# Patient Record
Sex: Male | Born: 1995 | Hispanic: Yes | Marital: Single | Smoking: Current every day smoker
Health system: Southern US, Community
[De-identification: ages and names within clinical notes are randomized; demographics above are authoritative.]

---

## 2021-06-14 ENCOUNTER — Emergency Department (HOSPITAL_COMMUNITY): Payer: Self-pay

## 2021-06-14 ENCOUNTER — Emergency Department (HOSPITAL_COMMUNITY)
Admission: EM | Admit: 2021-06-14 | Discharge: 2021-06-15 | Disposition: A | Discharge status: Home / Self Care | Payer: Self-pay | Attending: Emergency Medicine | Admitting: Emergency Medicine

## 2021-06-14 ENCOUNTER — Other Ambulatory Visit: Payer: Self-pay

## 2021-06-14 ENCOUNTER — Encounter (HOSPITAL_COMMUNITY): Payer: Self-pay

## 2021-06-14 DIAGNOSIS — T148XXA Other injury of unspecified body region, initial encounter: Secondary | ICD-10-CM

## 2021-06-14 DIAGNOSIS — Y9339 Activity, other involving climbing, rappelling and jumping off: Secondary | ICD-10-CM | POA: Insufficient documentation

## 2021-06-14 DIAGNOSIS — Y9259 Other trade areas as the place of occurrence of the external cause: Secondary | ICD-10-CM | POA: Insufficient documentation

## 2021-06-14 DIAGNOSIS — S90512A Abrasion, left ankle, initial encounter: Secondary | ICD-10-CM | POA: Insufficient documentation

## 2021-06-14 DIAGNOSIS — S90511A Abrasion, right ankle, initial encounter: Secondary | ICD-10-CM | POA: Insufficient documentation

## 2021-06-14 DIAGNOSIS — X58XXXA Exposure to other specified factors, initial encounter: Secondary | ICD-10-CM | POA: Insufficient documentation
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
## 2021-06-15 ENCOUNTER — Emergency Department (HOSPITAL_COMMUNITY)
Admission: EM | Admit: 2021-06-15 | Discharge: 2021-06-18 | Disposition: A | Discharge status: Other Acute Inpatient Hospital | Payer: Self-pay | Attending: Emergency Medicine | Admitting: Emergency Medicine

## 2021-06-15 ENCOUNTER — Encounter (HOSPITAL_COMMUNITY): Payer: Self-pay

## 2021-06-15 ENCOUNTER — Emergency Department (HOSPITAL_COMMUNITY): Payer: Self-pay

## 2021-06-15 DIAGNOSIS — Y900 Blood alcohol level of less than 20 mg/100 ml: Secondary | ICD-10-CM | POA: Insufficient documentation

## 2021-06-15 DIAGNOSIS — Z20822 Contact with and (suspected) exposure to covid-19: Secondary | ICD-10-CM | POA: Insufficient documentation

## 2021-06-15 DIAGNOSIS — F1994 Other psychoactive substance use, unspecified with psychoactive substance-induced mood disorder: Secondary | ICD-10-CM | POA: Diagnosis present

## 2021-06-15 DIAGNOSIS — F312 Bipolar disorder, current episode manic severe with psychotic features: Secondary | ICD-10-CM | POA: Insufficient documentation

## 2021-06-15 DIAGNOSIS — R45851 Suicidal ideations: Secondary | ICD-10-CM | POA: Insufficient documentation

## 2021-06-15 DIAGNOSIS — F39 Unspecified mood [affective] disorder: Secondary | ICD-10-CM | POA: Diagnosis present

## 2021-06-15 DIAGNOSIS — N179 Acute kidney failure, unspecified: Secondary | ICD-10-CM | POA: Insufficient documentation

## 2021-06-15 LAB — COMPREHENSIVE METABOLIC PANEL
ALT: 41 U/L (ref 0–44)
AST: 66 U/L — ABNORMAL HIGH (ref 15–41)
Albumin: 5.2 g/dL — ABNORMAL HIGH (ref 3.5–5.0)
Alkaline Phosphatase: 88 U/L (ref 38–126)
Anion gap: 12 (ref 5–15)
BUN: 27 mg/dL — ABNORMAL HIGH (ref 6–20)
CO2: 20 mmol/L — ABNORMAL LOW (ref 22–32)
Calcium: 9.5 mg/dL (ref 8.9–10.3)
Chloride: 100 mmol/L (ref 98–111)
Creatinine, Ser: 1.3 mg/dL — ABNORMAL HIGH (ref 0.61–1.24)
GFR, Estimated: >60 mL/min (ref >60)
Glucose, Bld: 109 mg/dL — ABNORMAL HIGH (ref 70–99)
Potassium: 3.5 mmol/L (ref 3.5–5.1)
Sodium: 132 mmol/L — ABNORMAL LOW (ref 135–145)
Total Bilirubin: 2.5 mg/dL — ABNORMAL HIGH (ref 0.3–1.2)
Total Protein: 9.1 g/dL — ABNORMAL HIGH (ref 6.5–8.1)

## 2021-06-15 LAB — ETHANOL: Alcohol, Ethyl (B): <10 mg/dL (ref <10)

## 2021-06-15 LAB — SALICYLATE LEVEL: Salicylate Lvl: <7 mg/dL — ABNORMAL LOW (ref 7.0–30.0)

## 2021-06-15 LAB — ACETAMINOPHEN LEVEL: Acetaminophen (Tylenol), Serum: <10 ug/mL — ABNORMAL LOW (ref 10–30)

## 2021-06-15 LAB — CBC WITH DIFFERENTIAL/PLATELET
Abs Immature Granulocytes: 0.03 10*3/uL (ref 0.00–0.07)
Basophils Absolute: 0.1 10*3/uL (ref 0.0–0.1)
Basophils Relative: 1 %
Eosinophils Absolute: 0.2 10*3/uL (ref 0.0–0.5)
Eosinophils Relative: 2 %
HCT: 46.3 % (ref 39.0–52.0)
Hemoglobin: 16.4 g/dL (ref 13.0–17.0)
Immature Granulocytes: 0 %
Lymphocytes Relative: 13 %
Lymphs Abs: 1.3 10*3/uL (ref 0.7–4.0)
MCH: 30.4 pg (ref 26.0–34.0)
MCHC: 35.4 g/dL (ref 30.0–36.0)
MCV: 85.9 fL (ref 80.0–100.0)
Monocytes Absolute: 0.7 10*3/uL (ref 0.1–1.0)
Monocytes Relative: 7 %
Neutro Abs: 7.4 10*3/uL (ref 1.7–7.7)
Neutrophils Relative %: 77 %
Platelets: 231 10*3/uL (ref 150–400)
RBC: 5.39 MIL/uL (ref 4.22–5.81)
RDW: 12.5 % (ref 11.5–15.5)
WBC: 9.6 10*3/uL (ref 4.0–10.5)
nRBC: 0 % (ref 0.0–0.2)

## 2021-06-15 LAB — RESP PANEL BY RT-PCR (FLU A&B, COVID) ARPGX2
Influenza A by PCR: NEGATIVE
Influenza B by PCR: NEGATIVE
SARS Coronavirus 2 by RT PCR: NEGATIVE

## 2021-06-15 MED ORDER — ONDANSETRON HCL 4 MG PO TABS: 4.0000 mg | ORAL_TABLET | Freq: Three times a day (TID) | ORAL | Status: DC | PRN

## 2021-06-15 MED ORDER — LORAZEPAM 1 MG PO TABS
1.0000 mg | ORAL_TABLET | ORAL | Status: DC | PRN
  Administered 2021-06-17: 1 mg via ORAL

## 2021-06-15 MED ORDER — ZIPRASIDONE MESYLATE 20 MG IM SOLR
10.0000 mg | Freq: Once | INTRAMUSCULAR | Status: AC
  Administered 2021-06-15: 10 mg via INTRAMUSCULAR
  Filled 2021-06-15: qty 20

## 2021-06-15 MED ORDER — ALUM & MAG HYDROXIDE-SIMETH 200-200-20 MG/5ML PO SUSP: 30.0000 mL | Freq: Four times a day (QID) | ORAL | Status: DC | PRN

## 2021-06-15 MED ORDER — ACETAMINOPHEN 500 MG PO TABS
1000.0000 mg | ORAL_TABLET | Freq: Once | ORAL | Status: AC
Start: 2021-06-15 — End: 2021-06-15
  Administered 2021-06-15: 1000 mg via ORAL
  Filled 2021-06-15: qty 2

## 2021-06-15 NOTE — ED Notes (Signed)
Patient transferred to TCU 27 and was cooperative until he was placed in room. Became aggressive and agitated, resisting treatment - order for restraints and meds given by MD. Patient being placed in restraints and PRN meds for agitation. JRPRN ?

## 2021-06-15 NOTE — ED Notes (Signed)
[facility] assisted pt to [facility] for pt to change from personal clothing into required [place] scrubs per policy  ?
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
## 2021-06-15 NOTE — Discharge Instructions (Signed)
Can use topical neosporin or similar if desired. ?Tylenol or motrin as needed for pain. ?Return here for new concerns. ?
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
## 2021-06-15 NOTE — ED Notes (Signed)
MD at the bedisde ?

## 2021-06-15 NOTE — BH Assessment (Signed)
Clinician reviewed pt's chart in preparation to complete pt's MH Assessment. However, it was documented at 2245 that pt became aggressive and agitated and that pt was being placed in restraints and given medication for agitation. TTS to attempt assessment once pt has calmed down. ?
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
## 2021-06-16 ENCOUNTER — Encounter (HOSPITAL_COMMUNITY): Payer: Self-pay

## 2021-06-16 DIAGNOSIS — F39 Unspecified mood [affective] disorder: Secondary | ICD-10-CM | POA: Diagnosis present

## 2021-06-16 DIAGNOSIS — F1994 Other psychoactive substance use, unspecified with psychoactive substance-induced mood disorder: Secondary | ICD-10-CM | POA: Diagnosis present

## 2021-06-16 LAB — RAPID URINE DRUG SCREEN, HOSP PERFORMED
Amphetamines: POSITIVE — AB
Barbiturates: NOT DETECTED
Benzodiazepines: NOT DETECTED
Cocaine: POSITIVE — AB
Opiates: NOT DETECTED
Tetrahydrocannabinol: POSITIVE — AB

## 2021-06-16 LAB — URINALYSIS, ROUTINE W REFLEX MICROSCOPIC
Bilirubin Urine: NEGATIVE
Glucose, UA: NEGATIVE mg/dL
Hgb urine dipstick: NEGATIVE
Ketones, ur: NEGATIVE mg/dL
Leukocytes,Ua: NEGATIVE
Nitrite: NEGATIVE
Protein, ur: NEGATIVE mg/dL
Specific Gravity, Urine: 1.03 (ref 1.005–1.030)
pH: 6 (ref 5.0–8.0)

## 2021-06-16 MED ORDER — ARIPIPRAZOLE 2 MG PO TABS
2.0000 mg | ORAL_TABLET | Freq: Every day | ORAL | Status: DC
  Administered 2021-06-16 – 2021-06-17 (×2): 2 mg via ORAL
  Filled 2021-06-16 (×2): qty 1

## 2021-06-16 MED ORDER — ESCITALOPRAM OXALATE 10 MG PO TABS
5.0000 mg | ORAL_TABLET | Freq: Every day | ORAL | Status: DC
  Administered 2021-06-16 – 2021-06-18 (×3): 5 mg via ORAL
  Filled 2021-06-16 (×3): qty 1

## 2021-06-16 NOTE — BH Assessment (Signed)
Checked Pt's status. RN reports Pt received medication and is currently too somnolent to participate in tele-assessment. ? ? ?Raymond [name], [facility] [place] ?Triage Specialist ?[phone] ? ?

## 2021-06-16 NOTE — ED Notes (Signed)
Patient resting quietly in bed. Restraints removed and blanket given. Resting with eyes closed, no longer agitated or disruptive. JRPRN ?

## 2021-06-16 NOTE — ED Notes (Signed)
Patient resting quietly in bed with his eyes closed, no outburst or agitation. JRPRN ?

## 2021-06-16 NOTE — Progress Notes (Signed)
Patient meets criteria for inpatient treatment per [facility] No appropriate beds at [facility] currently. CSW faxed referrals to the following facilities for review: ? ?[facility]   ?[facility]   ?[facility]   ?[facility]   ?[facility]   ?[facility]   ?[facility]   ?[facility]   ?[facility]   ?[facility]   ?[facility]   ?[facility]   ?[facility]   ?[facility]  ? ? ?TTS will continue to seek bed placement.  ? ? ?  ?Ruthann Cancer MSW, LCSW ?Clincal Social Worker  ?[facility]  ?

## 2021-06-16 NOTE — ED Notes (Signed)
Patient currently resting quietly with eyes closed.   ?

## 2021-06-16 NOTE — ED Notes (Signed)
Pt was calm, cooperative, and laid in bed with his eyes open from 7:07 AM to the present. Pt sat up to eat three meals. Pt performed ADLs independently. Pt denies SI and HI. Pt reports hearing voices.  ?

## 2021-06-16 NOTE — ED Provider Notes (Signed)
Emergency Medicine Observation Re-evaluation Note ? ?Raymond [name] is a 26 y.o. male, seen on rounds today.  Pt initially presented to the ED for complaints of Suicidal ?Currently, the patient is resting. ? ?Physical Exam  ?BP 104/85 (BP Location: Left Arm)   Pulse 79   Temp 97.7 ?F (36.5 ?C) (Oral)   Resp 18   Ht 1.702 m (5\' 7" )   Wt 68 kg   SpO2 100%   BMI 23.49 kg/m?  ?Physical Exam ?General: resting ?Cardiac: normal rate ?Lungs: breathing easily ?Psych: deferred, resting ? ?ED Course / MDM  ?EKG:EKG Interpretation ? ?Date/Time:  Friday June 15 2021 21:19:16 EDT ?Ventricular Rate:  93 ?PR Interval:  150 ?QRS Duration: 98 ?QT Interval:  352 ?QTC Calculation: 437 ?R Axis:   91 ?Text Interpretation: Normal sinus rhythm Rightward axis Borderline ECG No previous ECGs available Confirmed by [id number] [phone]) on 06/15/2021 9:34:37 PM ? ?I have reviewed the labs performed to date as well as medications administered while in observation.  Recent changes in the last 24 hours include no acute changes. ? ?Plan  ?Current plan is for tele health assessment to determine dispo. ? Raymond [name] is not under involuntary commitment. ? ? ?  ?Mayo Ao, MD ?06/16/21 [phone] ? ?

## 2021-06-16 NOTE — Consult Note (Signed)
[facility] Face-to-Face Psychiatry Consult  ? ?Reason for Consult:  Psychiatric evaluation ?Referring Physician:  ER Physician ?Patient Identification: Raymond [name] ?MRN:  [id number] ?Principal Diagnosis: Substance induced mood disorder (HCC) ?Diagnosis:  Principal Problem: ?  Substance induced mood disorder (HCC) ?Active Problems: ?  Mood disorder with psychosis (HCC) ? ? ?Total Time spent with patient: 1 hour-This includes the use of an interpreter to conduct this interview. ? ?Subjective:   ?Raymond [name] is a 26 y.o. male patient admitted with no previous Psychiatric hx brought in by [facility] for suicide ideation and was seen trying to jump off the [place]. ? ?HPI:  Patient was seen earlier on Friday for scratches all over his legs and wound to his ankle where he reported some AA males were chasing him to hurt him.  He was discharged after wound care but he came back to the ER last night , brought in by [employer].  Patient is Voluntary at this tie.  He was found trying to Jump off a bridge and again stated that some people are out to get him and kill him so he wanted to do it by himself.  At the time he admitted using Cocaine that period.  He was given Geodon injection for agitation and was on restrain temporarily.  He has since calmed dow, off restraint and engaged in meaningful interview.  Evaluation was conducted via a spanish interpreter.  Patient denied previous Psychiatric illness and denied family hx of Mental illness.  During the interview he denied using any drugs but UDS came back positive for Methamphetamine, Cocaine and Marijuana.  He is hearing voices currently and still believes people are chasing him in a car looking to kill him.  He denied feeling suicidal and no Homicidal ideation.  He admitted to feeling anxious and depressed but also reported no triggers.  He moved from [place] to [facility] [place] area two months ago. ?He meets criteria for inpatient hospitalization for safety and stabilization.  Meanwhile  Lexapro and Aripiprazole is prescribed for Depression and Psychosis while in the ER. ? ?Past Psychiatric History: Denied ? ?Risk to Self:   ?Risk to Others:   ?Prior Inpatient Therapy:   ?Prior Outpatient Therapy:   ? ?Past Medical History: History reviewed. No pertinent past medical history. History reviewed. No pertinent surgical history. ?Family History: History reviewed. No pertinent family history. ?Family Psychiatric  History: Denied ?Social History:  ?Social History  ? ?Substance and Sexual Activity  ?Alcohol Use Yes  ? Comment: ocassionally  ?   ?Social History  ? ?Substance and Sexual Activity  ?Drug Use Yes  ? Types: Cocaine  ?  ?Social History  ? ?Socioeconomic History  ? Marital status: Single  ?  Spouse name: Not on file  ? Number of children: Not on file  ? Years of education: Not on file  ? Highest education level: Not on file  ?Occupational History  ? Not on file  ?Tobacco Use  ? Smoking status: Every Day  ?  Types: Cigarettes  ? Smokeless tobacco: Never  ?Vaping Use  ? Vaping Use: Never used  ?Substance and Sexual Activity  ? Alcohol use: Yes  ?  Comment: ocassionally  ? Drug use: Yes  ?  Types: Cocaine  ? Sexual activity: Not on file  ?Other Topics Concern  ? Not on file  ?Social History Narrative  ? Not on file  ? ?Social Determinants of Health  ? ?Financial Resource Strain: Not on file  ?Food Insecurity: Not on  file  ?Transportation Needs: Not on file  ?Physical Activity: Not on file  ?Stress: Not on file  ?Social Connections: Not on file  ? ?Additional Social History: ?  ? ?Allergies:  No Known Allergies ? ?Labs:  ?Results for orders placed or performed during the hospital encounter of 06/15/21 (from the past 48 hour(s))  ?Resp Panel by RT-PCR (Flu A&B, Covid) Nasopharyngeal Swab     Status: None  ? Collection Time: 06/15/21  9:08 PM  ? Specimen: Nasopharyngeal Swab; Nasopharyngeal(NP) swabs in vial transport medium  ?Result Value Ref Range  ? SARS Coronavirus 2 by RT PCR NEGATIVE NEGATIVE  ?   Comment: (NOTE) ?SARS-CoV-2 target nucleic acids are NOT DETECTED. ? ?The SARS-CoV-2 RNA is generally detectable in upper respiratory ?specimens during the acute phase of infection. The lowest ?concentration of SARS-CoV-2 viral copies this assay can detect is ?138 copies/mL. A negative result does not preclude SARS-Cov-2 ?infection and should not be used as the sole basis for treatment or ?other patient management decisions. A negative result may occur with  ?improper specimen collection/handling, submission of specimen other ?than nasopharyngeal swab, presence of viral mutation(s) within the ?areas targeted by this assay, and inadequate number of viral ?copies(<138 copies/mL). A negative result must be combined with ?clinical observations, patient history, and epidemiological ?information. The expected result is Negative. ? ?Fact Sheet for Patients:  ?BloggerCourse.comhttps://www.fda.gov/media/152166/download ? ?Fact Sheet for Healthcare Providers:  ?SeriousBroker.ithttps://www.fda.gov/media/152162/download ? ?This test is no t yet approved or cleared by the [place] FDA and  ?has been authorized for detection and/or diagnosis of SARS-CoV-2 by ?FDA under an Emergency Use Authorization (EUA). This EUA will remain  ?in effect (meaning this test can be used) for the duration of the ?COVID-19 declaration under Section 564(b)(1) of the Act, 21 ?U.S.C.section 360bbb-3(b)(1), unless the authorization is terminated  ?or revoked sooner.  ? ? ?  ? Influenza A by PCR NEGATIVE NEGATIVE  ? Influenza B by PCR NEGATIVE NEGATIVE  ?  Comment: (NOTE) ?The Xpert Xpress SARS-CoV-2/FLU/RSV plus assay is intended as an aid ?in the diagnosis of influenza from Nasopharyngeal swab specimens and ?should not be used as a sole basis for treatment. Nasal washings and ?aspirates are unacceptable for Xpert Xpress SARS-CoV-2/FLU/RSV ?testing. ? ?Fact Sheet for Patients: ?BloggerCourse.comhttps://www.fda.gov/media/152166/download ? ?Fact Sheet for Healthcare  Providers: ?SeriousBroker.ithttps://www.fda.gov/media/152162/download ? ?This test is not yet approved or cleared by the [place] FDA and ?has been authorized for detection and/or diagnosis of SARS-CoV-2 by ?FDA under an Emergency Use Authorization (EUA). This EUA will remain ?in effect (meaning this test can be used) for the duration of the ?COVID-19 declaration under Section 564(b)(1) of the Act, 21 U.S.C. ?section 360bbb-3(b)(1), unless the authorization is terminated or ?revoked. ? ?Performed at [facility], [address] ?[place], [place] [id number] ?  ?Comprehensive metabolic panel     Status: Abnormal  ? Collection Time: 06/15/21  9:08 PM  ?Result Value Ref Range  ? Sodium 132 (L) 135 - 145 mmol/L  ? Potassium 3.5 3.5 - 5.1 mmol/L  ? Chloride 100 98 - 111 mmol/L  ? CO2 20 (L) 22 - 32 mmol/L  ? Glucose, Bld 109 (H) 70 - 99 mg/dL  ?  Comment: Glucose reference range applies only to samples taken after fasting for at least 8 hours.  ? BUN 27 (H) 6 - 20 mg/dL  ? Creatinine, Ser 1.30 (H) 0.61 - 1.24 mg/dL  ? Calcium 9.5 8.9 - 10.3 mg/dL  ? Total Protein 9.1 (H) 6.5 -  8.1 g/dL  ? Albumin 5.2 (H) 3.5 - 5.0 g/dL  ? AST 66 (H) 15 - 41 U/L  ? ALT 41 0 - 44 U/L  ? Alkaline Phosphatase 88 38 - 126 U/L  ? Total Bilirubin 2.5 (H) 0.3 - 1.2 mg/dL  ? GFR, Estimated >60 >60 mL/min  ?  Comment: (NOTE) ?Calculated using the CKD-EPI Creatinine Equation (2021) ?  ? Anion gap 12 5 - 15  ?  Comment: Performed at [facility], [address] [place], [place] [address]  ?Ethanol     Status: None  ? Collection Time: 06/15/21  9:08 PM  ?Result Value Ref Range  ? Alcohol, Ethyl (B) <10 <10 mg/dL  ?  Comment: (NOTE) ?Lowest detectable limit for serum alcohol is 10 mg/dL. ? ?For medical purposes only. ?Performed at [facility], [address] ?[place], [place] [address] ?  ?CBC with Diff     Status: None  ? Collection Time: 06/15/21  9:08 PM  ?Result Value Ref Range  ? WBC 9.6 4.0 - 10.5  K/uL  ? RBC 5.39 4.22 - 5.81 MIL/uL  ? Hemoglobin 16.4 13.0 - 17.0 g/dL  ? HCT 46.3 39.0 - 52.0 %  ? MCV 85.9 80.0 - 100.0 fL  ? MCH 30.4 26.0 - 34.0 pg  ? MCHC 35.4 30.0 - 36.0 g/dL  ? RDW 12.5 11.5 - 15.5 %  ? Platelets 231 150 - 4

## 2021-06-17 MED ORDER — LORAZEPAM 0.5 MG PO TABS: 0.5000 mg | ORAL_TABLET | Freq: Once | ORAL | Status: DC

## 2021-06-17 MED ORDER — OLANZAPINE 5 MG PO TBDP
2.5000 mg | ORAL_TABLET | Freq: Every day | ORAL | Status: DC
  Administered 2021-06-17: 2.5 mg via ORAL
  Filled 2021-06-17: qty 1

## 2021-06-17 MED ORDER — LORAZEPAM 0.5 MG PO TABS
0.5000 mg | ORAL_TABLET | Freq: Two times a day (BID) | ORAL | Status: DC
  Administered 2021-06-17 – 2021-06-18 (×3): 0.5 mg via ORAL
  Filled 2021-06-17 (×3): qty 1

## 2021-06-17 NOTE — ED Notes (Signed)
Patient repeatedly trying to leave room. Patient asking if "people outside were here for him". Used interpreter device to reassure patient safety and to remind to stay in room. Patient understood and went back to room. ?

## 2021-06-17 NOTE — ED Provider Notes (Signed)
Emergency Medicine Observation Re-evaluation Note ? ?Raymond [name] is a 26 y.o. male, seen on rounds today.  Pt initially presented to the ED for complaints of Suicidal ?Currently, the patient is is resting comfortably.  No acute distress.. ? ?Physical Exam  ?BP 131/74 (BP Location: Left Arm)   Pulse 69   Temp 98.6 ?F (37 ?C) (Oral)   Resp 20   Ht 1.702 m (5\' 7" )   Wt 68 kg   SpO2 96%   BMI 23.49 kg/m?  ?Physical Exam ?Constitutional:   ?   General: He is not in acute distress. ?Cardiovascular:  ?   Rate and Rhythm: Normal rate.  ?Pulmonary:  ?   Effort: No respiratory distress.  ?Psychiatric:     ?   Behavior: Behavior normal.  ? ? ? ?ED Course / MDM  ?EKG:EKG Interpretation ? ?Date/Time:  Friday June 15 2021 21:19:16 EDT ?Ventricular Rate:  93 ?PR Interval:  150 ?QRS Duration: 98 ?QT Interval:  352 ?QTC Calculation: 437 ?R Axis:   91 ?Text Interpretation: Normal sinus rhythm Rightward axis Borderline ECG No previous ECGs available Confirmed by [id number] [phone]) on 06/15/2021 9:34:37 PM ? ?I have reviewed the labs performed to date as well as medications administered while in observation.  Recent changes in the last 24 hours include no significant changes or abnormalities or concerns. ? ?Plan  ?Current plan is for behavioral health is continuing to evaluate and is looking for placement.  They are also directing treatment. ? Raymond [name] is not under involuntary commitment. ? ? ?  ?Mayo Ao, MD ?06/17/21 1536 ? ?

## 2021-06-17 NOTE — Consult Note (Signed)
[facility] Psych ED Progress Note ? ?06/17/2021 1:36 PM ?Mayo AoJose Hernandez [name]  ?MRN:  [id number] ? ? ?Method of visit?: Face to Face  ? ?Subjective:  VIA Spanish Interpreter-Patient was seen in his room this morning.  Noted is tremors of his fingers.  He reported feeling the shakes and admitted drinking enough alcohol to withdraw from on weekends.  Ativan is prescribed.  He is still hearing voices and did not sleep last night.  Aripiprazole is discontinued and Olanzapine low dose prescribed.  He is eating and drinking fluids and tolerating it.  We will continue to monitor patient who denies SI/HI/VH. ?Principal Problem: Substance induced mood disorder (HCC) ?Diagnosis:  Principal Problem: ?  Substance induced mood disorder (HCC) ?Active Problems: ?  Mood disorder with psychosis (HCC) ? ?Total Time spent with patient: 20 minutes ? ?Past Psychiatric History: see initial psych consult note ? ?Past Medical History: History reviewed. No pertinent past medical history. History reviewed. No pertinent surgical history. ?Family History: History reviewed. No pertinent family history. ?Family Psychiatric  History: see initial Psych Consult note ?Social History:  ?Social History  ? ?Substance and Sexual Activity  ?Alcohol Use Yes  ? Comment: ocassionally  ?   ?Social History  ? ?Substance and Sexual Activity  ?Drug Use Yes  ? Types: Cocaine  ?  ?Social History  ? ?Socioeconomic History  ? Marital status: Single  ?  Spouse name: Not on file  ? Number of children: Not on file  ? Years of education: Not on file  ? Highest education level: Not on file  ?Occupational History  ? Not on file  ?Tobacco Use  ? Smoking status: Every Day  ?  Types: Cigarettes  ? Smokeless tobacco: Never  ?Vaping Use  ? Vaping Use: Never used  ?Substance and Sexual Activity  ? Alcohol use: Yes  ?  Comment: ocassionally  ? Drug use: Yes  ?  Types: Cocaine  ? Sexual activity: Not on file  ?Other Topics Concern  ? Not on file  ?Social History Narrative  ? Not on file   ? ?Social Determinants of Health  ? ?Financial Resource Strain: Not on file  ?Food Insecurity: Not on file  ?Transportation Needs: Not on file  ?Physical Activity: Not on file  ?Stress: Not on file  ?Social Connections: Not on file  ? ? ?Sleep: Poor ? ?Appetite:  Fair ? ?Current Medications: ?Current Facility-Administered Medications  ?Medication Dose Route Frequency Provider Last Rate Last Admin  ? alum & mag hydroxide-simeth (MAALOX/MYLANTA) 200-200-20 MG/5ML suspension 30 mL  30 mL Oral Q6H PRN Jacalyn LefevreHaviland, Julie, MD      ? escitalopram (LEXAPRO) tablet 5 mg  5 mg Oral Daily Dahlia Byesnuoha, Jenavive Lamboy C, NP   5 mg at 06/17/21 1033  ? LORazepam (ATIVAN) tablet 0.5 mg  0.5 mg Oral Once Earney Navynuoha, Trudy Kory C, NP      ? LORazepam (ATIVAN) tablet 1 mg  1 mg Oral Q4H PRN Jacalyn LefevreHaviland, Julie, MD      ? OLANZapine zydis (ZYPREXA) disintegrating tablet 2.5 mg  2.5 mg Oral QHS Branndon Tuite C, NP      ? ondansetron (ZOFRAN) tablet 4 mg  4 mg Oral Q8H PRN Jacalyn LefevreHaviland, Julie, MD      ? ?No current outpatient medications on file.  ? ? ?Lab Results:  ?Results for orders placed or performed during the hospital encounter of 06/15/21 (from the past 48 hour(s))  ?Resp Panel by RT-PCR (Flu A&B, Covid) Nasopharyngeal Swab     Status:  None  ? Collection Time: 06/15/21  9:08 PM  ? Specimen: Nasopharyngeal Swab; Nasopharyngeal(NP) swabs in vial transport medium  ?Result Value Ref Range  ? SARS Coronavirus 2 by RT PCR NEGATIVE NEGATIVE  ?  Comment: (NOTE) ?SARS-CoV-2 target nucleic acids are NOT DETECTED. ? ?The SARS-CoV-2 RNA is generally detectable in upper respiratory ?specimens during the acute phase of infection. The lowest ?concentration of SARS-CoV-2 viral copies this assay can detect is ?138 copies/mL. A negative result does not preclude SARS-Cov-2 ?infection and should not be used as the sole basis for treatment or ?other patient management decisions. A negative result may occur with  ?improper specimen collection/handling, submission of  specimen other ?than nasopharyngeal swab, presence of viral mutation(s) within the ?areas targeted by this assay, and inadequate number of viral ?copies(<138 copies/mL). A negative result must be combined with ?clinical observations, patient history, and epidemiological ?information. The expected result is Negative. ? ?Fact Sheet for Patients:  ?BloggerCourse.com ? ?Fact Sheet for Healthcare Providers:  ?SeriousBroker.it ? ?This test is no t yet approved or cleared by the [place] FDA and  ?has been authorized for detection and/or diagnosis of SARS-CoV-2 by ?FDA under an Emergency Use Authorization (EUA). This EUA will remain  ?in effect (meaning this test can be used) for the duration of the ?COVID-19 declaration under Section 564(b)(1) of the Act, 21 ?U.S.C.section 360bbb-3(b)(1), unless the authorization is terminated  ?or revoked sooner.  ? ? ?  ? Influenza A by PCR NEGATIVE NEGATIVE  ? Influenza B by PCR NEGATIVE NEGATIVE  ?  Comment: (NOTE) ?The Xpert Xpress SARS-CoV-2/FLU/RSV plus assay is intended as an aid ?in the diagnosis of influenza from Nasopharyngeal swab specimens and ?should not be used as a sole basis for treatment. Nasal washings and ?aspirates are unacceptable for Xpert Xpress SARS-CoV-2/FLU/RSV ?testing. ? ?Fact Sheet for Patients: ?BloggerCourse.com ? ?Fact Sheet for Healthcare Providers: ?SeriousBroker.it ? ?This test is not yet approved or cleared by the [place] FDA and ?has been authorized for detection and/or diagnosis of SARS-CoV-2 by ?FDA under an Emergency Use Authorization (EUA). This EUA will remain ?in effect (meaning this test can be used) for the duration of the ?COVID-19 declaration under Section 564(b)(1) of the Act, 21 U.S.C. ?section 360bbb-3(b)(1), unless the authorization is terminated or ?revoked. ? ?Performed at [facility], [address] ?[place], [place] [address] ?  ?Comprehensive metabolic panel     Status: Abnormal  ? Collection Time: 06/15/21  9:08 PM  ?Result Value Ref Range  ? Sodium 132 (L) 135 - 145 mmol/L  ? Potassium 3.5 3.5 - 5.1 mmol/L  ? Chloride 100 98 - 111 mmol/L  ? CO2 20 (L) 22 - 32 mmol/L  ? Glucose, Bld 109 (H) 70 - 99 mg/dL  ?  Comment: Glucose reference range applies only to samples taken after fasting for at least 8 hours.  ? BUN 27 (H) 6 - 20 mg/dL  ? Creatinine, Ser 1.30 (H) 0.61 - 1.24 mg/dL  ? Calcium 9.5 8.9 - 10.3 mg/dL  ? Total Protein 9.1 (H) 6.5 - 8.1 g/dL  ? Albumin 5.2 (H) 3.5 - 5.0 g/dL  ? AST 66 (H) 15 - 41 U/L  ? ALT 41 0 - 44 U/L  ? Alkaline Phosphatase 88 38 - 126 U/L  ? Total Bilirubin 2.5 (H) 0.3 - 1.2 mg/dL  ? GFR, Estimated >60 >60 mL/min  ?  Comment: (NOTE) ?Calculated using the CKD-EPI Creatinine Equation (2021) ?  ? Anion gap  12 5 - 15  ?  Comment: Performed at [facility], [address] [place], [place] [address]  ?Ethanol     Status: None  ? Collection Time: 06/15/21  9:08 PM  ?Result Value Ref Range  ? Alcohol, Ethyl (B) <10 <10 mg/dL  ?  Comment: (NOTE) ?Lowest detectable limit for serum alcohol is 10 mg/dL. ? ?For medical purposes only. ?Performed at [facility], [address] ?[place], [place] [address] ?  ?CBC with Diff     Status: None  ? Collection Time: 06/15/21  9:08 PM  ?Result Value Ref Range  ? WBC 9.6 4.0 - 10.5 K/uL  ? RBC 5.39 4.22 - 5.81 MIL/uL  ? Hemoglobin 16.4 13.0 - 17.0 g/dL  ? HCT 46.3 39.0 - 52.0 %  ? MCV 85.9 80.0 - 100.0 fL  ? MCH 30.4 26.0 - 34.0 pg  ? MCHC 35.4 30.0 - 36.0 g/dL  ? RDW 12.5 11.5 - 15.5 %  ? Platelets 231 150 - 400 K/uL  ? nRBC 0.0 0.0 - 0.2 %  ? Neutrophils Relative % 77 %  ? Neutro Abs 7.4 1.7 - 7.7 K/uL  ? Lymphocytes Relative 13 %  ? Lymphs Abs 1.3 0.7 - 4.0 K/uL  ? Monocytes Relative 7 %  ? Monocytes Absolute 0.7 0.1 - 1.0 K/uL  ? Eosinophils Relative 2 %  ? Eosinophils Absolute 0.2 0.0 - 0.5 K/uL  ? Basophils  Relative 1 %  ? Basophils Absolute 0.1 0.0 - 0.1 K/uL  ? Immature Granulocytes 0 %  ? Abs Immature Granulocytes 0.03 0.00 - 0.07 K/uL  ?  Comment: Performed at [facility], [address]

## 2021-06-18 ENCOUNTER — Encounter (HOSPITAL_COMMUNITY): Payer: Self-pay | Admitting: Nurse Practitioner

## 2021-06-18 ENCOUNTER — Other Ambulatory Visit: Payer: Self-pay

## 2021-06-18 ENCOUNTER — Inpatient Hospital Stay (HOSPITAL_COMMUNITY)
Admission: AD | Admit: 2021-06-18 | Discharge: 2021-06-28 | DRG: 885 | Disposition: A | Discharge status: Home / Self Care | Payer: Federal, State, Local not specified - Other | Source: Intra-hospital | Attending: Psychiatry | Admitting: Psychiatry

## 2021-06-18 DIAGNOSIS — F39 Unspecified mood [affective] disorder: Principal | ICD-10-CM | POA: Diagnosis present

## 2021-06-18 DIAGNOSIS — F10239 Alcohol dependence with withdrawal, unspecified: Secondary | ICD-10-CM | POA: Diagnosis present

## 2021-06-18 DIAGNOSIS — F17210 Nicotine dependence, cigarettes, uncomplicated: Secondary | ICD-10-CM | POA: Diagnosis present

## 2021-06-18 DIAGNOSIS — F1994 Other psychoactive substance use, unspecified with psychoactive substance-induced mood disorder: Secondary | ICD-10-CM

## 2021-06-18 DIAGNOSIS — E559 Vitamin D deficiency, unspecified: Secondary | ICD-10-CM | POA: Diagnosis present

## 2021-06-18 DIAGNOSIS — Z20822 Contact with and (suspected) exposure to covid-19: Secondary | ICD-10-CM | POA: Diagnosis present

## 2021-06-18 DIAGNOSIS — E781 Pure hyperglyceridemia: Secondary | ICD-10-CM | POA: Diagnosis present

## 2021-06-18 DIAGNOSIS — F329 Major depressive disorder, single episode, unspecified: Secondary | ICD-10-CM | POA: Diagnosis present

## 2021-06-18 DIAGNOSIS — F15159 Other stimulant abuse with stimulant-induced psychotic disorder, unspecified: Secondary | ICD-10-CM | POA: Diagnosis present

## 2021-06-18 DIAGNOSIS — F4323 Adjustment disorder with mixed anxiety and depressed mood: Secondary | ICD-10-CM

## 2021-06-18 DIAGNOSIS — Z79899 Other long term (current) drug therapy: Secondary | ICD-10-CM

## 2021-06-18 DIAGNOSIS — F1590 Other stimulant use, unspecified, uncomplicated: Secondary | ICD-10-CM

## 2021-06-18 DIAGNOSIS — R45851 Suicidal ideations: Secondary | ICD-10-CM | POA: Diagnosis present

## 2021-06-18 DIAGNOSIS — F19959 Other psychoactive substance use, unspecified with psychoactive substance-induced psychotic disorder, unspecified: Principal | ICD-10-CM

## 2021-06-18 DIAGNOSIS — G4700 Insomnia, unspecified: Secondary | ICD-10-CM | POA: Diagnosis present

## 2021-06-18 DIAGNOSIS — F1020 Alcohol dependence, uncomplicated: Secondary | ICD-10-CM

## 2021-06-18 DIAGNOSIS — E785 Hyperlipidemia, unspecified: Secondary | ICD-10-CM | POA: Diagnosis present

## 2021-06-18 LAB — RESP PANEL BY RT-PCR (FLU A&B, COVID) ARPGX2
Influenza A by PCR: NEGATIVE
Influenza B by PCR: NEGATIVE
SARS Coronavirus 2 by RT PCR: NEGATIVE

## 2021-06-18 MED ORDER — OLANZAPINE 5 MG PO TBDP
5.0000 mg | ORAL_TABLET | Freq: Every day | ORAL | Status: DC
  Administered 2021-06-18: 5 mg via ORAL
  Filled 2021-06-18 (×4): qty 1

## 2021-06-18 MED ORDER — ENSURE ENLIVE PO LIQD
237.0000 mL | Freq: Two times a day (BID) | ORAL | Status: DC
  Administered 2021-06-19 – 2021-06-28 (×15): 237 mL via ORAL
  Filled 2021-06-18 (×23): qty 237

## 2021-06-18 MED ORDER — LORAZEPAM 1 MG PO TABS: 1.0000 mg | ORAL_TABLET | ORAL | Status: DC | PRN

## 2021-06-18 MED ORDER — FOLIC ACID 1 MG PO TABS
1.0000 mg | ORAL_TABLET | Freq: Every day | ORAL | Status: DC
  Administered 2021-06-18: 1 mg via ORAL
  Filled 2021-06-18: qty 1

## 2021-06-18 MED ORDER — ALUM & MAG HYDROXIDE-SIMETH 200-200-20 MG/5ML PO SUSP: 30.0000 mL | Freq: Four times a day (QID) | ORAL | Status: DC | PRN

## 2021-06-18 MED ORDER — OLANZAPINE 5 MG PO TBDP: 5.0000 mg | ORAL_TABLET | Freq: Every day | ORAL | Status: DC

## 2021-06-18 MED ORDER — FOLIC ACID 1 MG PO TABS
1.0000 mg | ORAL_TABLET | Freq: Every day | ORAL | Status: DC
  Administered 2021-06-19 – 2021-06-28 (×10): 1 mg via ORAL
  Filled 2021-06-18 (×3): qty 1
  Filled 2021-06-18 (×3): qty 7
  Filled 2021-06-18 (×7): qty 1

## 2021-06-18 MED ORDER — ESCITALOPRAM OXALATE 5 MG PO TABS
5.0000 mg | ORAL_TABLET | Freq: Every day | ORAL | Status: DC
  Administered 2021-06-19: 5 mg via ORAL
  Filled 2021-06-18 (×4): qty 1

## 2021-06-18 MED ORDER — TRAZODONE HCL 50 MG PO TABS
50.0000 mg | ORAL_TABLET | Freq: Every evening | ORAL | Status: DC | PRN
  Administered 2021-06-18 – 2021-06-27 (×8): 50 mg via ORAL
  Filled 2021-06-18 (×2): qty 1
  Filled 2021-06-18: qty 7
  Filled 2021-06-18 (×7): qty 1

## 2021-06-18 MED ORDER — ACETAMINOPHEN 325 MG PO TABS
650.0000 mg | ORAL_TABLET | Freq: Four times a day (QID) | ORAL | Status: DC | PRN
Start: 2021-06-18 — End: 2021-06-26
  Administered 2021-06-18 – 2021-06-19 (×2): 650 mg via ORAL
  Filled 2021-06-18 (×2): qty 2

## 2021-06-18 MED ORDER — THIAMINE HCL 100 MG PO TABS
100.0000 mg | ORAL_TABLET | Freq: Every day | ORAL | Status: DC
  Administered 2021-06-18: 100 mg via ORAL
  Filled 2021-06-18: qty 1

## 2021-06-18 MED ORDER — THIAMINE HCL 100 MG PO TABS
100.0000 mg | ORAL_TABLET | Freq: Every day | ORAL | Status: DC
  Administered 2021-06-19 – 2021-06-28 (×10): 100 mg via ORAL
  Filled 2021-06-18 (×4): qty 1
  Filled 2021-06-18: qty 7
  Filled 2021-06-18 (×3): qty 1
  Filled 2021-06-18: qty 7
  Filled 2021-06-18 (×2): qty 1
  Filled 2021-06-18: qty 7
  Filled 2021-06-18: qty 1

## 2021-06-18 MED ORDER — MAGNESIUM HYDROXIDE 400 MG/5ML PO SUSP: 30.0000 mL | Freq: Every day | ORAL | Status: DC | PRN

## 2021-06-18 MED ORDER — ONDANSETRON HCL 4 MG PO TABS: 4.0000 mg | ORAL_TABLET | Freq: Three times a day (TID) | ORAL | Status: DC | PRN

## 2021-06-18 MED ORDER — NICOTINE 14 MG/24HR TD PT24
14.0000 mg | MEDICATED_PATCH | Freq: Every day | TRANSDERMAL | Status: DC
  Administered 2021-06-19 – 2021-06-28 (×10): 14 mg via TRANSDERMAL
  Filled 2021-06-18 (×14): qty 1

## 2021-06-18 MED ORDER — LORAZEPAM 0.5 MG PO TABS
0.5000 mg | ORAL_TABLET | Freq: Two times a day (BID) | ORAL | Status: DC
  Administered 2021-06-18 – 2021-06-19 (×2): 0.5 mg via ORAL
  Filled 2021-06-18 (×2): qty 1

## 2021-06-18 NOTE — Tx Team (Signed)
Initial Treatment Plan ?06/18/2021 ?7:07 PM ?Mayo Ao ?[id number] ? ? ? ?PATIENT STRESSORS: ?Substance abuse   ?Other: Paranoia   ? ? ?PATIENT STRENGTHS: ?Work skills  ? ? ?PATIENT IDENTIFIED PROBLEMS: ?SI with a plan to jump off a bridge  ?Paranoia   ?Anxiety   ?Substance abuse  ?  ?  ?  ?  ?  ?  ? ?DISCHARGE CRITERIA:  ?Adequate post-discharge living arrangements ?Improved stabilization in mood, thinking, and/or behavior ?Safe-care adequate arrangements made ?Withdrawal symptoms are absent or subacute and managed without 24-hour nursing intervention ? ?PRELIMINARY DISCHARGE PLAN: ?Attend PHP/IOP ?Attend 12-step recovery group ?Outpatient therapy ?Return to previous work or school arrangements ? ?PATIENT/FAMILY INVOLVEMENT: ?This treatment plan has been presented to and reviewed with the patient, Raymond [name].  The patient has been given the opportunity to ask questions and make suggestions. ? ?Sofie Hartigan, RN ?06/18/2021, 7:07 PM ?

## 2021-06-18 NOTE — [facility] Group Notes (Signed)
PT was informed but did not attend group. ?

## 2021-06-18 NOTE — Progress Notes (Addendum)
Pt admitted to [facility]. Patient admitted under involuntary commitment. Patient presents pleasant and cooperative. He reports he has been drinking 30 beers over the weekends on Fridays, Saturdays and sundays and using cocaine and marijuana. He reports he often hears voices and was having thoughts of harming himself, describes voices as unpleasant. He reports he has never been hospitalized and does not take mental health medications. He is able to contract for safety. Patient skin searched and no contraband found. Pt oriented to unit and ward rules. Pt provided dinner, drink and toiletries. He does report a headache and feeling tremulous, palms sweaty.  ?

## 2021-06-18 NOTE — ED Provider Notes (Signed)
Emergency Medicine Observation Re-evaluation Note ? ?Raymond [name] is a 26 y.o. male, seen on rounds today.  Pt initially presented to the ED for complaints of Suicidal ?Currently, the patient is resting, no distress. ? ?Physical Exam  ?BP 104/71 (BP Location: Left Arm)   Pulse 65   Temp 97.6 ?F (36.4 ?C) (Oral)   Resp 18   Ht 5\' 7"  (1.702 m)   Wt 68 kg   SpO2 94%   BMI 23.49 kg/m?  ?Physical Exam ?General: calm, resting ?Cardiac: rrr ?Lungs: equal chest rise noted b/l ?Psych: calm, cooperative with staff currently  ? ?ED Course / MDM  ?EKG:EKG Interpretation ? ?Date/Time:  Friday June 15 2021 21:19:16 EDT ?Ventricular Rate:  93 ?PR Interval:  150 ?QRS Duration: 98 ?QT Interval:  352 ?QTC Calculation: 437 ?R Axis:   91 ?Text Interpretation: Normal sinus rhythm Rightward axis Borderline ECG No previous ECGs available Confirmed by Isla Pence [id number]) on 06/15/2021 9:34:37 PM ? ?I have reviewed the labs performed to date as well as medications administered while in observation.  Recent changes in the last 24 hours include psychiatry has evaluated the patient, patient tremulous, Ativan as needed possible withdrawal. ? ?Plan  ?Current plan is for placement. ? Khylin Kittler is not under involuntary commitment. ? ? ?  ?Jeanell Sparrow, DO ?06/18/21 [phone] ? ?

## 2021-06-18 NOTE — ED Notes (Addendum)
Used video translation service to communicate with pt. Interpreter Areli # [phone] translated. Explained to pt he is transferring to [facility], and he will receive psychiatric treatment at that location. Pt requested we contact his employer [employer] at [employer] [phone] in room 52. I attempted to contact [employer] but no answer. Will continue to attempt contact and pass this information to the [facility] nurse. [employer] does not speak English and will need translation service. Pt is concerned about maintaining his employment and wants to speak with [employer] as soon as possible. Returned pt belongings and he confirmed they are all present. ?

## 2021-06-18 NOTE — Consult Note (Signed)
[facility] ED Progress Note ? ?06/18/2021 11:48 AM ?Mayo Ao  ?MRN:  [id number] ? ? ?Method of visit?: Face to Face  ? ?Subjective:  Seen today via Spanish interpreter.  Patient reported severe anxiety with fingers tremors noted.  He reported suicide ideation off and on and still believes people are out in the community to hurt him.  He also continues to hear voices in his head talking contineously but he cannot make out what the voices are saying.  At the same time he is worried about his job.  Provider explained to him the need to complete treatment before going back to the society.  We will continue to pursue inpatient Psych admission while treating him.  Olanzapine is increased and he is taking Ativan for Alcohol withdrawal symptoms. ?Principal Problem: Substance induced mood disorder (HCC) ?Diagnosis:  Principal Problem: ?  Substance induced mood disorder (HCC) ?Active Problems: ?  Mood disorder with psychosis (HCC) ? ?Total Time spent with patient: 20 minutes ? ?Past Psychiatric History: See initial Psych Consult ? ?Past Medical History: History reviewed. No pertinent past medical history. History reviewed. No pertinent surgical history. ?Family History: History reviewed. No pertinent family history. ?Family Psychiatric  History: see initial psych consult ?Social History:  ?Social History  ? ?Substance and Sexual Activity  ?Alcohol Use Yes  ? Comment: ocassionally  ?   ?Social History  ? ?Substance and Sexual Activity  ?Drug Use Yes  ? Types: Cocaine  ?  ?Social History  ? ?Socioeconomic History  ? Marital status: Single  ?  Spouse name: Not on file  ? Number of children: Not on file  ? Years of education: Not on file  ? Highest education level: Not on file  ?Occupational History  ? Not on file  ?Tobacco Use  ? Smoking status: Every Day  ?  Types: Cigarettes  ? Smokeless tobacco: Never  ?Vaping Use  ? Vaping Use: Never used  ?Substance and Sexual Activity  ? Alcohol use: Yes  ?  Comment: ocassionally   ? Drug use: Yes  ?  Types: Cocaine  ? Sexual activity: Not on file  ?Other Topics Concern  ? Not on file  ?Social History Narrative  ? Not on file  ? ?Social Determinants of Health  ? ?Financial Resource Strain: Not on file  ?Food Insecurity: Not on file  ?Transportation Needs: Not on file  ?Physical Activity: Not on file  ?Stress: Not on file  ?Social Connections: Not on file  ? ? ?Sleep: Fair ? ?Appetite:  Fair ? ?Current Medications: ?Current Facility-Administered Medications  ?Medication Dose Route Frequency Provider Last Rate Last Admin  ? alum & mag hydroxide-simeth (MAALOX/MYLANTA) 200-200-20 MG/5ML suspension 30 mL  30 mL Oral Q6H PRN Jacalyn Lefevre, MD      ? escitalopram (LEXAPRO) tablet 5 mg  5 mg Oral Daily Shantice Menger C, NP   5 mg at 06/18/21 1002  ? folic acid (FOLVITE) tablet 1 mg  1 mg Oral Daily Tanda Rockers A, DO   1 mg at 06/18/21 1002  ? LORazepam (ATIVAN) tablet 0.5 mg  0.5 mg Oral BID Dahlia Byes C, NP   0.5 mg at 06/18/21 1002  ? LORazepam (ATIVAN) tablet 1 mg  1 mg Oral Q4H PRN Jacalyn Lefevre, MD   1 mg at 06/17/21 2127  ? OLANZapine zydis (ZYPREXA) disintegrating tablet 5 mg  5 mg Oral QHS Tiarrah Saville C, NP      ? ondansetron (ZOFRAN) tablet 4 mg  4 mg Oral Q8H PRN Jacalyn Lefevre, MD      ? thiamine tablet 100 mg  100 mg Oral Daily Tanda Rockers A, DO   100 mg at 06/18/21 1002  ? ?No current outpatient medications on file.  ? ? ?Lab Results: No results found for this or any previous visit (from the past 48 hour(s)). ? ?Blood Alcohol level:  ?Lab Results  ?Component Value Date  ? ETH <10 06/15/2021  ? ? ?Physical Findings: ?AIMS:  , ,  ,  ,    ?CIWA:    ?COWS:    ? ?Musculoskeletal: ?Strength & Muscle Tone: within normal limits ?Gait & Station: normal ?Patient leans: Front ? ?Psychiatric Specialty Exam: ? ?Presentation  ?General Appearance: Appropriate for Environment ? ?Eye Contact:Good ? ?Speech:Clear and Coherent; Normal Rate (via Spanish interpreter) ? ?Speech  Volume:Normal (Via Spanish Interpreter.) ? ?Handedness:Right ? ? ?Mood and Affect  ?Mood:Anxious; Depressed ? ?Affect:Congruent ? ? ?Thought Process  ?Thought Processes:Coherent; Goal Directed ? ?Descriptions of Associations:Intact ? ?Orientation:Full (Time, Place and Person) ? ?Thought Content:Logical; Paranoid Ideation ? ?History of Schizophrenia/Schizoaffective disorder:No ? ?Duration of Psychotic Symptoms:No data recorded ?Hallucinations:Hallucinations: Auditory ?Description of Auditory Hallucinations: Hearing voices but cannot make out what is being said ? ?Ideas of Reference:Paranoia (People are out to get him.) ? ?Suicidal Thoughts:Suicidal Thoughts: Yes, Passive ?SI Passive Intent and/or Plan: Without Plan; With Access to Means ? ?Homicidal Thoughts:Homicidal Thoughts: No ? ? ?Sensorium  ?Memory:Immediate Good; Recent Good; Remote Fair ? ?Judgment:Fair ? ?Insight:Good ? ? ?Executive Functions  ?Concentration:Fair ? ?Attention Span:Fair ? ?Recall:Fair ? ?Fund of Knowledge:Fair ? ?Language:Fair ? ? ?Psychomotor Activity  ?Psychomotor Activity:Psychomotor Activity: Tremor ? ? ?Assets  ?Assets:Communication Skills ? ? ?Sleep  ?Sleep:Sleep: Fair ? ? ? ?Physical Exam: ?Physical Exam ?Vitals and nursing note reviewed.  ?Constitutional:   ?   Appearance: Normal appearance.  ?HENT:  ?   Head: Normocephalic and atraumatic.  ?   Nose: Nose normal.  ?Cardiovascular:  ?   Rate and Rhythm: Normal rate and regular rhythm.  ?Musculoskeletal:     ?   General: Normal range of motion.  ?   Cervical back: Normal range of motion.  ?Skin: ?   General: Skin is warm and dry.  ?Neurological:  ?   General: No focal deficit present.  ?   Mental Status: He is alert.  ? ?Review of Systems  ?Constitutional: Negative.   ?HENT: Negative.    ?Eyes: Negative.   ?Respiratory: Negative.    ?Cardiovascular: Negative.   ?Musculoskeletal: Negative.   ?Skin: Negative.   ?Neurological: Negative.   ?Endo/Heme/Allergies: Negative.    ?Psychiatric/Behavioral:  Positive for depression, hallucinations and suicidal ideas.   ?Blood pressure 104/71, pulse 65, temperature 97.6 ?F (36.4 ?C), temperature source Oral, resp. rate 18, height 5\' 7"  (1.702 m), weight 68 kg, SpO2 94 %. Body mass index is 23.49 kg/m?. ? ?Treatment Plan Summary: ?Daily contact with patient to assess and evaluate symptoms and progress in treatment, Medication management, and Plan He continues to meet criteria for inpatient Psych hospitalization.  Olanzapine is increased to 5 mg at bed time while we continue looking for bed. ? ?FREEMON BINFORD, NP-PMHNP-BC ?06/18/2021, 11:48 AM ? ?

## 2021-06-19 DIAGNOSIS — F39 Unspecified mood [affective] disorder: Secondary | ICD-10-CM

## 2021-06-19 LAB — VITAMIN B12: Vitamin B-12: 888 pg/mL (ref 180–914)

## 2021-06-19 LAB — COMPREHENSIVE METABOLIC PANEL
ALT: 38 U/L (ref 0–44)
AST: 42 U/L — ABNORMAL HIGH (ref 15–41)
Albumin: 4.3 g/dL (ref 3.5–5.0)
Alkaline Phosphatase: 107 U/L (ref 38–126)
Anion gap: 8 (ref 5–15)
BUN: 17 mg/dL (ref 6–20)
CO2: 26 mmol/L (ref 22–32)
Calcium: 9.3 mg/dL (ref 8.9–10.3)
Chloride: 106 mmol/L (ref 98–111)
Creatinine, Ser: 0.84 mg/dL (ref 0.61–1.24)
GFR, Estimated: >60 mL/min (ref >60)
Glucose, Bld: 85 mg/dL (ref 70–99)
Potassium: 3.7 mmol/L (ref 3.5–5.1)
Sodium: 140 mmol/L (ref 135–145)
Total Bilirubin: 0.4 mg/dL (ref 0.3–1.2)
Total Protein: 7.6 g/dL (ref 6.5–8.1)

## 2021-06-19 LAB — VITAMIN D 25 HYDROXY (VIT D DEFICIENCY, FRACTURES): Vit D, 25-Hydroxy: 29.73 ng/mL — ABNORMAL LOW (ref 30–100)

## 2021-06-19 LAB — LIPID PANEL
Cholesterol: 196 mg/dL (ref 0–200)
HDL: 49 mg/dL (ref >40)
LDL Cholesterol: 80 mg/dL (ref 0–99)
Total CHOL/HDL Ratio: 4 RATIO
Triglycerides: 333 mg/dL — ABNORMAL HIGH (ref <150)
VLDL: 67 mg/dL — ABNORMAL HIGH (ref 0–40)

## 2021-06-19 LAB — TSH: TSH: 3.094 u[IU]/mL (ref 0.350–4.500)

## 2021-06-19 LAB — HEMOGLOBIN A1C
Hgb A1c MFr Bld: 5.4 % (ref 4.8–5.6)
Mean Plasma Glucose: 108.28 mg/dL

## 2021-06-19 MED ORDER — ADULT MULTIVITAMIN W/MINERALS CH
1.0000 | ORAL_TABLET | Freq: Every day | ORAL | Status: DC
  Administered 2021-06-19 – 2021-06-28 (×10): 1 via ORAL
  Filled 2021-06-19: qty 1
  Filled 2021-06-19: qty 7
  Filled 2021-06-19 (×4): qty 1
  Filled 2021-06-19: qty 7
  Filled 2021-06-19: qty 1
  Filled 2021-06-19: qty 7
  Filled 2021-06-19 (×3): qty 1

## 2021-06-19 MED ORDER — ESCITALOPRAM OXALATE 10 MG PO TABS
10.0000 mg | ORAL_TABLET | Freq: Every day | ORAL | Status: DC
  Administered 2021-06-20 – 2021-06-28 (×9): 10 mg via ORAL
  Filled 2021-06-19: qty 1
  Filled 2021-06-19: qty 7
  Filled 2021-06-19: qty 1
  Filled 2021-06-19: qty 7
  Filled 2021-06-19: qty 1
  Filled 2021-06-19: qty 7
  Filled 2021-06-19 (×4): qty 1
  Filled 2021-06-19: qty 7
  Filled 2021-06-19: qty 1

## 2021-06-19 MED ORDER — ONDANSETRON 4 MG PO TBDP: 4.0000 mg | ORAL_TABLET | Freq: Four times a day (QID) | ORAL | Status: AC | PRN

## 2021-06-19 MED ORDER — HYDROXYZINE HCL 25 MG PO TABS
25.0000 mg | ORAL_TABLET | Freq: Four times a day (QID) | ORAL | Status: AC | PRN
  Administered 2021-06-19 – 2021-06-21 (×3): 25 mg via ORAL
  Filled 2021-06-19 (×3): qty 1

## 2021-06-19 MED ORDER — THIAMINE HCL 100 MG PO TABS: 100.0000 mg | ORAL_TABLET | Freq: Every day | ORAL | Status: DC

## 2021-06-19 MED ORDER — OLANZAPINE 10 MG PO TBDP
10.0000 mg | ORAL_TABLET | Freq: Every day | ORAL | Status: DC
  Administered 2021-06-19: 10 mg via ORAL
  Filled 2021-06-19 (×3): qty 1

## 2021-06-19 MED ORDER — LORAZEPAM 1 MG PO TABS: 1.0000 mg | ORAL_TABLET | Freq: Four times a day (QID) | ORAL | Status: AC | PRN

## 2021-06-19 MED ORDER — LOPERAMIDE HCL 2 MG PO CAPS: 2.0000 mg | ORAL_CAPSULE | ORAL | Status: AC | PRN

## 2021-06-19 NOTE — Group Note (Signed)
Recreation Therapy Group Note ? ? ?Group Topic:Animal Assisted Therapy   ?Group Date: 06/19/2021 ?Start Time: 1430 ?End Time: 1515 ?Facilitators: Caroll Rancher, LRT,CTRS ?Location: [address] Day[address] ? ? ?Animal-Assisted Activity (AAA) Program Checklist/Progress Note ?Patient Eligibility Criteria Checklist & Daily Group note for Rec Tx Intervention ? ?AAA/T Program Assumption of Risk Form signed by Patient/ or Parent Legal Guardian YES ? ?Patient understands their participation is voluntary YES ? ?Group Description: Patients provided opportunity to interact with trained and credentialed [facility] Therapy dog and the community volunteer/dog handler. Patients practiced appropriate animal interaction and were educated on dog safety outside of the hospital in common community settings. Patients were allowed to use dog toys and other items to practice commands, engage the dog in play, and/or complete routine aspects of animal care.  ? ?Education: Charity fundraiser, Health visitor, Communication & Social Skills  ? ? ?Affect/Mood: N/A ?  ?Participation Level: Did not attend ?  ? ?Clinical Observations/Individualized Feedback: Pt attended and participated in group session.  ? ? ?Plan: Continue to engage patient in RT group sessions 2-3x/week. ? ? ?Caroll Rancher, LRT,CTRS ?06/19/2021 4:24 PM ?

## 2021-06-19 NOTE — BHH Suicide Risk Assessment (Signed)
[facility] Admission Suicide Risk Assessment ? ? ?Nursing information obtained from:    ?Demographic factors:  Male, Adolescent or young adult ?Current Mental Status:  Self-harm thoughts, Self-harm behaviors, Suicidal ideation indicated by patient ?Loss Factors:  Loss of significant relationship, Financial problems / change in socioeconomic status ?Historical Factors:  NA ?Risk Reduction Factors:  Employed ? ?Total Time spent with patient: 45 minutes ?Principal Problem: Mood disorder with psychosis (HCC) ?Diagnosis:  Principal Problem: ?  Mood disorder with psychosis (HCC) ? ?Subjective Data:  ?Patient is a 26 year old male who denies having previously diagnosed psychiatric history, who was admitted to the psychiatric hospital for evaluation and treatment of suicidal thoughts with intent and plan to jump off bridge, paranoia, and auditory hallucinations.  He was admitted from [facility], which provided medical clearance. ?  ?Prior to admission psychiatric medications: None ? ?Interview today was conducted by a [place] interpreter ? ?My assessment today, with interpreter, patient reports "I was felt that going crazy" and reports auditory hallucinations for about 2 weeks, with the content of those voices telling him that other people are out to get him, other people are chasing him, and others are out to kill him.  Patient reports he called the police due to this.  Patient reports he started to believe and continues to believe that other people are following him and cars are following him.  He reports he feels overwhelmed due to this.  He reports the hallucinations and paranoia have caused him to feel very depressed, sad, and overwhelmed, to the extent that he has intense suicidal thoughts to kill himself by jumping off a bridge, due to the feeling overwhelmed by the content of the paranoid delusions and auditory hallucinations. ?Patient reports using cocaine on weekends for some time.  Denies meth use.  Reports  marijuana use off and on.  Patient also reports excessive and high level of alcohol use, 20-30 beers per weekend, and 2-3 beers per week day.  Patient reports that recently sleep has been much less, with difficulty initiating and maintaining sleep.  Reports low energy.  Reports appetite is okay. ?At this time he continues to have suicidal thoughts, without intent or plan, and is agreeable to approach staff if suicidal thoughts become more intense, he starts to have intent, or plan.  Denies having homicidal thoughts.  Denies having access to guns. ?Patient reports that anxiety is excessive, elevated, acute, and related to content of paranoid delusions and auditory hallucinations.  Denies having difficulty managing level of anxiety prior to psychotic symptoms. ?Denies panic attacks ?Denies symptoms being criteria for manic or hypomanic episode at this time or in the past. ?Denies history of trauma.  Denies history of abuse. ? ?Continued Clinical Symptoms:  ?Alcohol Use Disorder Identification Test Final Score (AUDIT): 23 ?The "Alcohol Use Disorders Identification Test", Guidelines for Use in Primary Care, Second Edition.  World Science writer [facility]). ?Score between 0-7:  no or low risk or alcohol related problems. ?Score between 8-15:  moderate risk of alcohol related problems. ?Score between 16-19:  high risk of alcohol related problems. ?Score 20 or above:  warrants further diagnostic evaluation for alcohol dependence and treatment. ? ? ?CLINICAL FACTORS:  ? Severe Anxiety and/or Agitation ?Depression:   Hopelessness ?Impulsivity ?Insomnia ?Currently Psychotic ? ? ?Musculoskeletal: ?Strength & Muscle Tone: Laying in bed  ? ?Gait & Station: Laying in bed  ? ?Patient leans: Laying in bed  ? ? ?Psychiatric Specialty Exam: ? ?Presentation  ?General Appearance: Disheveled ? ?Eye Contact:Fleeting ? ?  Speech:Normal Rate ? ?Speech Volume:Normal ? ?Handedness:Right ? ? ?Mood and Affect  ?Mood:Anxious; Depressed;  Labile ? ?Affect:Appropriate; Congruent; Tearful ? ? ?Thought Process  ?Thought Processes:Linear ? ?Descriptions of Associations:Intact ? ?Orientation:Full (Time, Place and Person) ? ?Thought Content:Paranoid Ideation ? ?History of Schizophrenia/Schizoaffective disorder:No ? ?Duration of Psychotic Symptoms:No data recorded ?Hallucinations:Hallucinations: Auditory ? ?Ideas of Reference:Paranoia; Delusions ? ?Suicidal Thoughts:Suicidal Thoughts: Yes, Passive ?SI Passive Intent and/or Plan: Without Intent; Without Plan ? ?Homicidal Thoughts:Homicidal Thoughts: No ? ? ?Sensorium  ?Memory:Immediate Fair; Recent Fair; Remote Fair ? ?Judgment:Poor ? ?Insight:Poor ? ? ?Executive Functions  ?Concentration:Fair ? ?Attention Span:Fair ? ?Recall:Fair ? ?Fund of Knowledge:Fair ? ?Language:Fair ? ? ?Psychomotor Activity  ?Psychomotor Activity:Psychomotor Activity: Normal ? ? ?Assets  ?Assets:Communication Skills ? ? ?Sleep  ?Sleep:Sleep: Poor ? ? ? ?Physical Exam: ?Physical Exam See H&P ? ?ROS See H&P ? ?Blood pressure 109/70, pulse 69, temperature (!) 97.3 ?F (36.3 ?C), temperature source Oral, resp. rate 18, height 5' 5.5" (1.664 m), weight 67.1 kg, SpO2 100 %. Body mass index is 24.25 kg/m?. ? ? ?COGNITIVE FEATURES THAT CONTRIBUTE TO RISK:  ?Active substance use ? ?SUICIDE RISK:  ? Moderate:  Frequent suicidal ideation with limited intensity, and duration, some specificity in terms of plans, no associated intent, good self-control, limited dysphoria/symptomatology, some risk factors present, and identifiable protective factors, including available and accessible social support. ? ?PLAN OF CARE:  ? ?See H&P for my assessment, diagnosis list, and plan. ? ?I certify that inpatient services furnished can reasonably be expected to improve the patient's condition.  ? ?Cristy Hilts, MD ?06/19/2021, 12:56 PM ? ?

## 2021-06-19 NOTE — H&P (Signed)
Psychiatric Admission Assessment Adult ? ?Patient Identification: Raymond AoJose Hernandez [name] ?MRN:  [id number] ?Date of Evaluation:  06/19/2021 ?Chief Complaint:  Mood disorder with psychosis (HCC) [F39] ?Principal Diagnosis: Mood disorder with psychosis (HCC) ?Diagnosis:  Principal Problem: ?  Mood disorder with psychosis (HCC) ? ?History of Present Illness:  ?Patient is a 26 year old male who denies having previously diagnosed psychiatric history, who was admitted to the psychiatric hospital for evaluation and treatment of suicidal thoughts with intent and plan to jump off bridge, paranoia, and auditory hallucinations.  He was admitted from [facility], which provided medical clearance. ? ?Prior to admission psychiatric medications: None ? ?Interview today was conducted by a [place] interpreter ? ?My assessment today, with interpreter, patient reports "I was felt that going crazy" and reports auditory hallucinations for about 2 weeks, with the content of those voices telling him that other people are out to get him, other people are chasing him, and others are out to kill him.  Patient reports he called the police due to this.  Patient reports he started to believe and continues to believe that other people are following him and cars are following him.  He reports he feels overwhelmed due to this.  He reports the hallucinations and paranoia have caused him to feel very depressed, sad, and overwhelmed, to the extent that he has intense suicidal thoughts to kill himself due to the feeling overwhelmed by the content of the paranoid delusions and auditory hallucinations. ?Patient reports using cocaine on weekends for some time.  Denies meth use.  Reports marijuana use off and on.  Patient also reports excessive and high level of alcohol use, 20-30 beers per weekend, and 2-3 beers per week day.  Patient reports that recently sleep has been much less, with difficulty initiating and maintaining sleep.  Reports low  energy.  Reports appetite is okay. ?At this time he continues to have suicidal thoughts, without intent or plan, and is agreeable to approach staff if suicidal thoughts become more intense, he starts to have intent, or plan.  Denies having homicidal thoughts.  Denies having access to guns. ?Patient reports that anxiety is excessive, elevated, acute, and related to content of paranoid delusions and auditory hallucinations.  Denies having difficulty managing level of anxiety prior to psychotic symptoms. ?Denies panic attacks ?Denies symptoms being criteria for manic or hypomanic episode at this time or in the past. ?Denies history of trauma.  Denies history of abuse. ? ?Past psychiatric history: ?Denies history of psychiatric diagnosis in the past ?Denies prior psychiatric hospitalization ?Denies history of suicide attempt ?Denies current psychiatric medication prescription ?Denies history of psychiatric medications ? ?Has medical history: ?Denies acute or chronic medical illness ?Denies history of seizures ?Denies history of surgery ?NKDA ? ?Social history: Single.  No children.  Employed in [employer].  Lives with a friend.  Has been in [place] for 2 months.  Reports she was born in [place], moved to the [place] 4 years ago, and moved to the local area 2 months ago. ? ?Family history: Denies family history of psychiatric illness.  Denies family history of suicide attempt. ? ?Substance use: Alcohol: Drinks 20-30 beers per weekend, drinks 2-3 beers per weekday.  Reports heavy drinking for about 2 years ?Tobacco: Reports smoking about 1 cigarette/month ?Marijuana: Reports off and on use, not daily ?Cocaine use: Reports using cocaine on weekends for some time ?Denies other illicit drug use including meth, heroin and opiates ? ? ? ? ?Total Time spent with patient: 45 minutes ? ? ? ?  Is the patient at risk to self? Yes.    ?Has the patient been a risk to self in the past 6 months? No.  ?Has the patient been a risk to self  within the distant past? No.  ?Is the patient a risk to others? No.  ?Has the patient been a risk to others in the past 6 months? No.  ?Has the patient been a risk to others within the distant past? No.  ? ?Prior Inpatient Therapy:   ?Prior Outpatient Therapy:   ? ?Alcohol Screening: 1. How often do you have a drink containing alcohol?: 2 to 3 times a week ?2. How many drinks containing alcohol do you have on a typical day when you are drinking?: 10 or more ?3. How often do you have six or more drinks on one occasion?: Weekly ?AUDIT-C Score: 10 ?4. How often during the last year have you found that you were not able to stop drinking once you had started?: Weekly ?5. How often during the last year have you failed to do what was normally expected from you because of drinking?: Monthly ?6. How often during the last year have you needed a first drink in the morning to get yourself going after a heavy drinking session?: Weekly ?7. How often during the last year have you had a feeling of guilt of remorse after drinking?: Monthly ?8. How often during the last year have you been unable to remember what happened the night before because you had been drinking?: Weekly ?9. Have you or someone else been injured as a result of your drinking?: No ?10. Has a relative or friend or a doctor or another health worker been concerned about your drinking or suggested you cut down?: No ?Alcohol Use Disorder Identification Test Final Score (AUDIT): 23 ?Alcohol Brief Interventions/Follow-up: Alcohol education/Brief advice ?Substance Abuse History in the last 12 months:  Yes.   ?Consequences of Substance Abuse: ?Negative ?Previous Psychotropic Medications: No  ?Psychological Evaluations: No  ?Past Medical History: History reviewed. No pertinent past medical history. History reviewed. No pertinent surgical history. ?Family History: History reviewed. No pertinent family history. ? ?Tobacco Screening: Reports smoking 1 cigarette/month ? ?Social  History:  ?Social History  ? ?Substance and Sexual Activity  ?Alcohol Use Yes  ? Alcohol/week: 30.0 standard drinks  ? Types: 30 Cans of beer per week  ? Comment: 30 beers on the weekend  ?   ?Social History  ? ?Substance and Sexual Activity  ?Drug Use Yes  ? Types: Cocaine  ?  ?Additional Social History: ?  ?   ?  ?  ?  ?  ?  ?  ?  ?  ?  ?  ? ?Allergies:  No Known Allergies ?Lab Results:  ?Results for orders placed or performed during the hospital encounter of 06/15/21 (from the past 48 hour(s))  ?Resp Panel by RT-PCR (Flu A&B, Covid) Nasopharyngeal Swab     Status: None  ? Collection Time: 06/18/21 12:07 PM  ? Specimen: Nasopharyngeal Swab; Nasopharyngeal(NP) swabs in vial transport medium  ?Result Value Ref Range  ? SARS Coronavirus 2 by RT PCR NEGATIVE NEGATIVE  ?  Comment: (NOTE) ?SARS-CoV-2 target nucleic acids are NOT DETECTED. ? ?The SARS-CoV-2 RNA is generally detectable in upper respiratory ?specimens during the acute phase of infection. The lowest ?concentration of SARS-CoV-2 viral copies this assay can detect is ?138 copies/mL. A negative result does not preclude SARS-Cov-2 ?infection and should not be used as the sole basis for treatment or ?  other patient management decisions. A negative result may occur with  ?improper specimen collection/handling, submission of specimen other ?than nasopharyngeal swab, presence of viral mutation(s) within the ?areas targeted by this assay, and inadequate number of viral ?copies(<138 copies/mL). A negative result must be combined with ?clinical observations, patient history, and epidemiological ?information. The expected result is Negative. ? ?Fact Sheet for Patients:  ?BloggerCourse.com ? ?Fact Sheet for Healthcare Providers:  ?SeriousBroker.it ? ?This test is no t yet approved or cleared by the [place] FDA and  ?has been authorized for detection and/or diagnosis of SARS-CoV-2 by ?FDA under an Emergency Use  Authorization (EUA). This EUA will remain  ?in effect (meaning this test can be used) for the duration of the ?COVID-19 declaration under Section 564(b)(1) of the Act, 21 ?U.S.C.section 360bbb-3(b)(1), unless t

## 2021-06-19 NOTE — Progress Notes (Signed)
Patient endorses AH and denies SI/HI/VH and verbally contracted for safety, with the use of video remote interpreter Spanish to [place]. Compliant with medications, no adverse drug noted. Q 15 minutes safety checks ongoing without self harm gestures  ? ?Support and encouragement provided.  ?

## 2021-06-19 NOTE — BHH Group Notes (Signed)
Pt did not attend group. 

## 2021-06-19 NOTE — Progress Notes (Signed)
NUTRITION ASSESSMENT ? ?Pt identified as at risk on the Malnutrition Screen Tool ? ?INTERVENTION: ?1. Supplements: Ensure Plus High Protein po BID, each supplement provides 350 kcal and 20 grams of protein.  ? ?NUTRITION DIAGNOSIS: ?Unintentional weight loss related to sub-optimal intake as evidenced by pt report.  ? ?Goal: ?Pt to meet >/= 90% of their estimated nutrition needs. ? ?Monitor:  ?PO intake ? ?Assessment:  ?Pt admitted for substance abuse (EtOH, cocaine, THC). Pt reports drinking 30 beers over the weekend.  ?Per weight records, pt has lost 12 lbs since 4/20. Suspect time frame is inaccurate. ?Ensure supplements have been ordered. ? ? ?Height: ?Ht Readings from Last 1 Encounters:  ?06/18/21 5' 5.5" (1.664 m)  ? ? ?Weight: ?Wt Readings from Last 1 Encounters:  ?06/18/21 67.1 kg  ? ? ?Weight Hx: ?Wt Readings from Last 10 Encounters:  ?06/18/21 67.1 kg  ?06/15/21 68 kg  ?06/14/21 72.6 kg  ? ? ?BMI:  Body mass index is 24.25 kg/m?Marland Kitchen ?Pt meets criteria for normal based on current BMI. ? ?Estimated Nutritional Needs: ?Kcal: 25-30 kcal/kg ?Protein: > 1 gram protein/kg ?Fluid: 1 ml/kcal ? ?Diet Order:  ?Diet Order   ? ?       ?  Diet regular Room service appropriate? Yes; Fluid consistency: Thin  Diet effective now       ?  ? ?  ?  ? ?  ? ?Pt is also offered choice of unit snacks mid-morning and mid-afternoon.  ?Pt is eating as desired.  ? ?Lab results and medications reviewed.  ? ?Tilda Franco, MS, RD, LDN ?Inpatient Clinical Dietitian ?Contact information available via Amion ? ? ?

## 2021-06-19 NOTE — Progress Notes (Addendum)
Pt denies HI but endorses AVH, "shadows moving in my room and voices saying weird, nonsense things." Pt also endorses SI on/off and verbally agrees to approach staff if these become apparent or before harming themselves/others. Rates depression 7/10. Rates anxiety 6/10. Rates pain 0/10. RN spoke with interpretor and upon her entering his room, he was tearful and told the interpretor that the reason he was on the bridge was because he feels lonely since all his family is in [place] except for some cousin in [place], [place]. Pt states he wants to go back to [place]. Pt stated that his mother has a medical diagnosis and is stressing about that. 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 Pt left phone on a charging station at the ED. It is a [employer] and it has all his phone numbers in it that he needs. AC notified. Scheduled medications administered to pt, per MD orders. RN provided support and encouragement to pt. Q15 min safety checks implemented and continued. Pt safe on the unit. RN will continue to monitor and intervene as needed.  ? 06/19/21 0728  ?Psych Admission Type (Psych Patients Only)  ?Admission Status Involuntary  ?Psychosocial Assessment  ?Patient Complaints Self-harm thoughts;Other (Comment) ?(AVH)  ?Eye Contact Fair  ?Facial Expression Sad  ?Affect Appropriate to circumstance;Sad  ?Speech Logical/coherent  ?Interaction Forwards little;Minimal  ?Motor Activity Slow  ?Appearance/Hygiene In scrubs  ?Behavior Characteristics Cooperative;Appropriate to situation  ?Mood Sad;Pleasant  ?Thought Process  ?Coherency WDL  ?Content Blaming others;Blaming self  ?Delusions Paranoid  ?Perception Hallucinations  ?Hallucination Auditory;Visual  ?Judgment Impaired  ?Confusion None  ?Danger to Self  ?Current suicidal ideation? Passive  ?Self-Injurious Behavior No self-injurious ideation or behavior indicators observed or expressed   ?Agreement Not to Harm Self Yes  ?Description of Agreement  verbal  ?Danger to Others  ?Danger to Others None reported or observed  ? ? ?

## 2021-06-20 ENCOUNTER — Encounter (HOSPITAL_COMMUNITY): Payer: Self-pay

## 2021-06-20 DIAGNOSIS — F1020 Alcohol dependence, uncomplicated: Secondary | ICD-10-CM

## 2021-06-20 DIAGNOSIS — F4323 Adjustment disorder with mixed anxiety and depressed mood: Secondary | ICD-10-CM

## 2021-06-20 DIAGNOSIS — F1590 Other stimulant use, unspecified, uncomplicated: Secondary | ICD-10-CM

## 2021-06-20 DIAGNOSIS — F19959 Other psychoactive substance use, unspecified with psychoactive substance-induced psychotic disorder, unspecified: Principal | ICD-10-CM

## 2021-06-20 DIAGNOSIS — F39 Unspecified mood [affective] disorder: Secondary | ICD-10-CM | POA: Diagnosis not present

## 2021-06-20 MED ORDER — OLANZAPINE 10 MG IM SOLR: 5.0000 mg | Freq: Three times a day (TID) | INTRAMUSCULAR | Status: DC | PRN

## 2021-06-20 MED ORDER — OLANZAPINE 2.5 MG PO TABS
12.5000 mg | ORAL_TABLET | Freq: Every day | ORAL | Status: DC
  Administered 2021-06-20: 12.5 mg via ORAL
  Filled 2021-06-20 (×2): qty 5

## 2021-06-20 MED ORDER — OLANZAPINE 2.5 MG PO TABS
2.5000 mg | ORAL_TABLET | Freq: Every day | ORAL | Status: DC
  Administered 2021-06-21 – 2021-06-24 (×4): 2.5 mg via ORAL
  Filled 2021-06-20 (×7): qty 1

## 2021-06-20 MED ORDER — OLANZAPINE 2.5 MG PO TABS
2.5000 mg | ORAL_TABLET | Freq: Once | ORAL | Status: AC
  Administered 2021-06-20: 2.5 mg via ORAL
  Filled 2021-06-20 (×2): qty 1

## 2021-06-20 MED ORDER — OLANZAPINE 5 MG PO TABS: 5.0000 mg | ORAL_TABLET | Freq: Three times a day (TID) | ORAL | Status: DC | PRN

## 2021-06-20 NOTE — Progress Notes (Signed)
Chaplain provided support to [place] with the assistance of an interpreter. He was very tearful as he shared about the thoughts that he has been having.  He also shared about how sad he is to not be able to talk with his family.  His mother's birthday was yesterday but because his phone was lost in the ED, he does not have their phone numbers and can't call them.  He is also very worried about them not know where he is and worrying about him.  His mother has some health issues and this has made the separation even more difficult.  Chaplain provided listening, prayer and a Bible in [place], at patient's request. ? ?[employer], Bcc ?Pager, [phone] ?

## 2021-06-20 NOTE — BHH Group Notes (Signed)
Patient did not attend the NA group. 

## 2021-06-20 NOTE — BH IP Treatment Plan (Signed)
Interdisciplinary Treatment and Diagnostic Plan Update ? ?06/20/2021 ?Time of Session: 9:30am  ?Raymond [name] ?MRN: [id number] ? ?Principal Diagnosis: Psychoactive substance-induced psychosis ([place]) ? ?Secondary Diagnoses: Principal Problem: ?  Psychoactive substance-induced psychosis ([place]) ?Active Problems: ?  Adjustment disorder with mixed anxiety and depressed mood ?  Alcohol use disorder, severe, dependence ([place]) ?  Stimulant use disorder ? ? ?Current Medications:  ?Current Facility-Administered Medications  ?Medication Dose Route Frequency Provider Last Rate Last Admin  ? acetaminophen (TYLENOL) tablet 650 mg  650 mg Oral Q6H PRN Charmaine Downs C, NP   650 mg at 06/19/21 1048  ? alum & mag hydroxide-simeth (MAALOX/MYLANTA) 200-200-20 MG/5ML suspension 30 mL  30 mL Oral Q6H PRN Charmaine Downs C, NP      ? escitalopram (LEXAPRO) tablet 10 mg  10 mg Oral Daily Massengill, Ovid Curd, MD   10 mg at 06/20/21 0801  ? feeding supplement (ENSURE ENLIVE / ENSURE PLUS) liquid 237 mL  237 mL Oral BID BM Hill, Jackie Plum, MD   237 mL at 22/97/98 9211  ? folic acid (FOLVITE) tablet 1 mg  1 mg Oral Daily Onuoha, Josephine C, NP   1 mg at 06/20/21 0801  ? hydrOXYzine (ATARAX) tablet 25 mg  25 mg Oral Q6H PRN Janine Limbo, MD   25 mg at 06/20/21 0646  ? loperamide (IMODIUM) capsule 2-4 mg  2-4 mg Oral PRN Massengill, Ovid Curd, MD      ? LORazepam (ATIVAN) tablet 1 mg  1 mg Oral Q6H PRN Massengill, Nathan, MD      ? magnesium hydroxide (MILK OF MAGNESIA) suspension 30 mL  30 mL Oral Daily PRN Charmaine Downs C, NP      ? multivitamin with minerals tablet 1 tablet  1 tablet Oral Daily Massengill, Nathan, MD   1 tablet at 06/20/21 0802  ? nicotine (NICODERM CQ - dosed in mg/24 hours) patch 14 mg  14 mg Transdermal Daily Maida Sale, MD   14 mg at 06/20/21 0802  ? OLANZapine zydis (ZYPREXA) disintegrating tablet 10 mg  10 mg Oral QHS Massengill, Nathan, MD   10 mg at 06/19/21 2104  ? ondansetron  (ZOFRAN-ODT) disintegrating tablet 4 mg  4 mg Oral Q6H PRN Massengill, Ovid Curd, MD      ? thiamine tablet 100 mg  100 mg Oral Daily Onuoha, Josephine C, NP   100 mg at 06/20/21 0802  ? traZODone (DESYREL) tablet 50 mg  50 mg Oral QHS PRN Charmaine Downs C, NP   50 mg at 06/19/21 2104  ? ?PTA Medications: ?No medications prior to admission.  ? ? ?Patient Stressors: Substance abuse   ?Other: Paranoia   ? ?Patient Strengths: Work Psychologist, occupational  ? ?Treatment Modalities: Medication Management, Group therapy, Case management,  ?1 to 1 session with clinician, Psychoeducation, Recreational therapy. ? ? ?Physician Treatment Plan for Primary Diagnosis: Psychoactive substance-induced psychosis ([place]) ?Long Term Goal(s): Improvement in symptoms so as ready for discharge  ? ?Short Term Goals: Ability to identify changes in lifestyle to reduce recurrence of condition will improve ?Ability to verbalize feelings will improve ?Ability to disclose and discuss suicidal ideas ?Ability to demonstrate self-control will improve ?Ability to identify and develop effective coping behaviors will improve ?Ability to maintain clinical measurements within normal limits will improve ?Compliance with prescribed medications will improve ?Ability to identify triggers associated with substance abuse/mental health issues will improve ? ?Medication Management: Evaluate patient's response, side effects, and tolerance of medication regimen. ? ?Therapeutic Interventions: 1 to 1 sessions, Unit  Group sessions and Medication administration. ? ?Evaluation of Outcomes: Not Met ? ?Physician Treatment Plan for Secondary Diagnosis: Principal Problem: ?  Psychoactive substance-induced psychosis ([place]) ?Active Problems: ?  Adjustment disorder with mixed anxiety and depressed mood ?  Alcohol use disorder, severe, dependence ([place]) ?  Stimulant use disorder ? ?Long Term Goal(s): Improvement in symptoms so as ready for discharge  ? ?Short Term Goals: Ability to identify  changes in lifestyle to reduce recurrence of condition will improve ?Ability to verbalize feelings will improve ?Ability to disclose and discuss suicidal ideas ?Ability to demonstrate self-control will improve ?Ability to identify and develop effective coping behaviors will improve ?Ability to maintain clinical measurements within normal limits will improve ?Compliance with prescribed medications will improve ?Ability to identify triggers associated with substance abuse/mental health issues will improve    ? ?Medication Management: Evaluate patient's response, side effects, and tolerance of medication regimen. ? ?Therapeutic Interventions: 1 to 1 sessions, Unit Group sessions and Medication administration. ? ?Evaluation of Outcomes: Not Met ? ? ?RN Treatment Plan for Primary Diagnosis: Psychoactive substance-induced psychosis ([place]) ?Long Term Goal(s): Knowledge of disease and therapeutic regimen to maintain health will improve ? ?Short Term Goals: Ability to remain free from injury will improve, Ability to participate in decision making will improve, Ability to verbalize feelings will improve, Ability to disclose and discuss suicidal ideas, and Ability to identify and develop effective coping behaviors will improve ? ?Medication Management: RN will administer medications as ordered by provider, will assess and evaluate patient's response and provide education to patient for prescribed medication. RN will report any adverse and/or side effects to prescribing provider. ? ?Therapeutic Interventions: 1 on 1 counseling sessions, Psychoeducation, Medication administration, Evaluate responses to treatment, Monitor vital signs and CBGs as ordered, Perform/monitor CIWA, COWS, AIMS and Fall Risk screenings as ordered, Perform wound care treatments as ordered. ? ?Evaluation of Outcomes: Not Met ? ? ?LCSW Treatment Plan for Primary Diagnosis: Psychoactive substance-induced psychosis ([place]) ?Long Term Goal(s): Safe transition to  appropriate next level of care at discharge, Engage patient in therapeutic group addressing interpersonal concerns. ? ?Short Term Goals: Engage patient in aftercare planning with referrals and resources, Increase social support, Increase emotional regulation, Facilitate acceptance of mental health diagnosis and concerns, Identify triggers associated with mental health/substance abuse issues, and Increase skills for wellness and recovery ? ?Therapeutic Interventions: Assess for all discharge needs, 1 to 1 time with Education officer, museum, Explore available resources and support systems, Assess for adequacy in community support network, Educate family and significant other(s) on suicide prevention, Complete Psychosocial Assessment, Interpersonal group therapy. ? ?Evaluation of Outcomes: Not Met ? ? ?Progress in Treatment: ?Attending groups: Yes. ?Participating in groups: Yes. ?Taking medication as prescribed: Yes. ?Toleration medication: Yes. ?Family/Significant other contact made: Yes, individual(s) contacted:  If consents are provided  ?Patient understands diagnosis: No. ?Discussing patient identified problems/goals with staff: Yes. ?Medical problems stabilized or resolved: Yes. ?Denies suicidal/homicidal ideation: Yes. ?Issues/concerns per patient self-inventory: No. ? ? ?New problem(s) identified: No, Describe:  None  ? ?New Short Term/Long Term Goal(s): medication stabilization, elimination of SI thoughts, development of comprehensive mental wellness plan.  ? ?Patient Goals: Did not attend  ? ?Discharge Plan or Barriers: Patient recently admitted. CSW will continue to follow and assess for appropriate referrals and possible discharge planning.  ? ?Reason for Continuation of Hospitalization: Anxiety ?Hallucinations ?Medication stabilization ?Suicidal ideation ? ?Estimated Length of Stay: 3 to 5 days  ? ?Last 3 [place] Suicide Severity Risk Score: ?[facility] Admission (Current)  from 06/18/2021 in [facility] ED from 06/15/2021 in [facility] ED from 06/14/2021 in [facility] DEPT  ?C-SSRS RISK CATEGORY High Risk High Risk No Risk  ? ?

## 2021-06-20 NOTE — BHH Counselor (Signed)
Adult Comprehensive Assessment ? ?Patient ID: Raymond [name], male   DOB: [birth year], 26 y.o.   MRN: [id number] ? ?Information Source: ?Information source: Interpreter ? ?Current Stressors:  ?Patient states their primary concerns and needs for treatment are:: Patient reports that he has been frustrated, deperate, and wanting to jump off a bridge ?Patient states their goals for this hospitilization and ongoing recovery are:: patient would like the bad thoughts to not be there ?Educational / Learning stressors: no stressors ?Employment / Job issues: no stressors, not sure what his next work is but usually finds work ?Family Relationships: Patient family all lives in [place]. Patient reports that he has not had very much contact due to leaving his phone in ED last Saturday.  Patient was having contact with family but has not since last saturday.  He can not remember phone numbers. ?Financial / Lack of resources (include bankruptcy): no income ?Housing / Lack of housing: patient has been living in hotel rooms ?Physical health (include injuries & life threatening diseases): no stressors ?Social relationships: patient discussed having very little social relationships.  He has one friend local who he provided permission for [place] to speak to ?Substance abuse: Cocaine and a little alcohol.  Patient reports neither affects his daily living ?Bereavement / Loss: no recent loss ? ?Living/Environment/Situation:  ?Living Arrangements: Alone ?Living conditions (as described by patient or guardian): patient lives in hotels ?Who else lives in the home?: no one ?How long has patient lived in current situation?: 5 years ?What is atmosphere in current home: Temporary ? ?Family History:  ?Marital status: Single ?Are you sexually active?: No ?What is your sexual orientation?: heterosexual ?Has your sexual activity been affected by drugs, alcohol, medication, or emotional stress?: no ?Does patient have children?: No ? ?Childhood History:   ?By whom was/is the patient raised?: Both parents ?Additional childhood history information: patient reports that it was difficult due to father dying when he was 53 years old ?Description of patient's relationship with caregiver when they were a child: good ?Patient's description of current relationship with people who raised him/her: good ?How were you disciplined when you got in trouble as a child/adolescent?: normally ?Does patient have siblings?: Yes ?Number of Siblings: 6 ?Description of patient's current relationship with siblings: good ?Did patient suffer any verbal/emotional/physical/sexual abuse as a child?: No ?Did patient suffer from severe childhood neglect?: No ?Has patient ever been sexually abused/assaulted/raped as an adolescent or adult?: No ?Was the patient ever a victim of a crime or a disaster?: No ?Witnessed domestic violence?: No ?Has patient been affected by domestic violence as an adult?: No ? ?Education:  ?Highest grade of school patient has completed: finished secondary school ?Currently a student?: No ?Learning disability?: Yes ?What learning problems does patient have?: patient was told he had a hard time in math ? 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 ?Financial Resources:   ?Financial resources: Income from employment ?Does patient have a representative payee or guardian?: No ? ?Alcohol/Substance Abuse:   ?What has been your use of drugs/alcohol within the last 12 months?: Cocaine and a little alcohol ?If attempted suicide, did drugs/alcohol play a role in this?: No ?Alcohol/Substance Abuse Treatment Hx: Denies past history ?If yes,  describe treatment: none ?Has alcohol/substance abuse  ever caused legal problems?: No ? ?Social Support System:   ?Patient's Community Support System: Poor ?Describe Community Support System: friend and family in [place] ?Type of faith/religion: [employer] ?How does patient's faith help to cope with current illness?: patient reports that he reads the bible ? ?Leisure/Recreation:   ?Do You Have Hobbies?: Yes ?Leisure and Hobbies: window shopping, drawing, reading ? ?Strengths/Needs:   ?What is the patient's perception of their strengths?: patient states that he is always positive and always going to try something before giving up ?Patient states they can use these personal strengths during their treatment to contribute to their recovery: yes ?Patient states these barriers may affect/interfere with their treatment: none ?Patient states these barriers may affect their return to the community: none ?Other important information patient would like considered in planning for their treatment: none ? ?Discharge Plan:   ?Currently receiving community mental health services: No ?Patient states concerns and preferences for aftercare planning are: interpreter ?Patient states they will know when they are safe and ready for discharge when: patient states that he wants negative thoughts to go away ?Does patient have access to transportation?: No ?Does patient have financial barriers related to discharge medications?: Yes ?Patient description of barriers related to discharge medications: no insurance ?Plan for no access to transportation at discharge: CSW will continue to assess ?Will patient be returning to same living situation after discharge?: Yes ? ?Summary/Recommendations:   ?Summary and Recommendations (to be completed by the evaluator): Raymond [name] is a 26 year old male who was admitted to [facility] due to suicidal thoughts, paranoia and auditory hallucinations.  Patient endorses cocaine use on the weekends for the past 2 years and a little alcohol use.  Patient not interested in  substance use treatment at this time and reports that it does not affect his daily life.  [place] the [place] 5 [place] and has been working with his hands and doing piping work.  He reports that he moves around based off where the work is.  Patient currently does not have insurance and has very little income.  He would be interested in counseling services.  While here, Haseeb can benefit from crisis stabilization, medication management, therapeutic milieu, and referrals for services. ? ?Akila Batta E Harneet Noblett. 06/20/2021 ?

## 2021-06-20 NOTE — Group Note (Signed)
Recreation Therapy Group Note ? ? ?Group Topic:Stress Management  ?Group Date: 06/20/2021 ?Start Time: 38 ?End Time: 0955 ?Facilitators: Caroll Rancher, LRT,CTRS ?Location: [address] Dayroom ? ? ?Goal Area(s) Addresses:  ?Patient will identify positive stress management techniques. ?Patient will identify benefits of using stress management post d/c. ? ?Group Description:  Meditation.  LRT played a meditation that focused on setting intention for the day.  It also focused on making the best use for your time and not focusing on things from the previous day but to focus on what you can in the now.  Patients were to listen and follow along as meditation played to fully engage.  Patients also learned they could find other stress management techniques from Youtube, apps and the Internet in general. ? ? ?Affect/Mood: Appropriate ?  ?Participation Level: Engaged ?  ?Participation Quality: Independent ?  ?Behavior: Appropriate ?  ?Speech/Thought Process: Focused ?  ?Insight: Good ?  ?Judgement: Good ?  ?Modes of Intervention: Meditation ?  ?Patient Response to Interventions:  Engaged ?  ?Education Outcome: ? Acknowledges education and In group clarification offered   ? ?Clinical Observations/Individualized Feedback: With the assistance of an interpreter, pt was able to engage in group meditation session.   ? ? ?Plan: Continue to engage patient in RT group sessions 2-3x/week. ? ? ?Caroll Rancher, LRT,CTRS ?06/20/2021 12:21 PM ?

## 2021-06-20 NOTE — Progress Notes (Signed)
?   06/20/21 0800  ?Psych Admission Type (Psych Patients Only)  ?Admission Status Involuntary  ?Psychosocial Assessment  ?Patient Complaints None  ?Eye Contact Fair  ?Facial Expression Flat  ?Affect Flat  ?Speech Logical/coherent  ?Interaction Forwards little  ?Motor Activity Slow  ?Appearance/Hygiene In scrubs  ?Behavior Characteristics Cooperative;Appropriate to situation;Calm  ?Mood Pleasant  ?Thought Process  ?Coherency WDL  ?Content WDL  ?Delusions None reported or observed  ?Perception WDL  ?Hallucination None reported or observed  ?Judgment Impaired  ?Confusion None  ?Danger to Self  ?Current suicidal ideation? Passive  ?Self-Injurious Behavior No self-injurious ideation or behavior indicators observed or expressed   ?Agreement Not to Harm Self Yes  ?Description of Agreement Verbal  ?Danger to Others  ?Danger to Others None reported or observed  ? ? ?

## 2021-06-20 NOTE — Progress Notes (Signed)
Patient compliant with medications endorses AH and Passive SI.Q 15 minutes safety checks ongoing. Pt. Contracts for safety at this time. Patient has been visible in Milieu. Support and encouragement provided.  ?

## 2021-06-20 NOTE — Group Note (Signed)
LCSW Group Therapy Note ? ? ?Group Date: 06/20/2021 ?Start Time: 1300 ?End Time: 1400 ? ?Type of Therapy and Topic:  Group Therapy:  Strengths Exploration ? ? ?Participation Level: Active ? ?Description of Group: ?This group allows individuals to explore their strengths, learn to use strengths in new ways to improve well-being. Strengths-based interventions involve identifying strengths, understanding how they are used, and learning new ways to apply them. Individuals will identify their strengths, and then explore their roles in different areas of life (relationships, professional life, and personal fulfillment). Individuals will think about ways in which they currently use their strengths, along with new ways they could begin using them. ? ?  ?Therapeutic Goals ?Patient will verbalize two of their strengths ?Patient will identify how their strengths are currently used ?Patient will identify two new ways to apply their strengths  ?Patients will create a plan to apply their strengths in their daily lives  ?  ? ?Summary of Patient Progress:  The Pt attended group and remained there the entire time.  The Pt accepted the worksheets that were provided and followed along throughout the group session. They were appropriate with their peers and demonstrated knowledge of the topic being discussed.  The Pt was unable to think of a strength.  ? ?Aram Beecham, LCSWA ?06/20/2021  2:13 PM   ? ?

## 2021-06-20 NOTE — Progress Notes (Signed)
[facility] MD Progress Note ? ?06/20/2021 3:26 PM ?Mayo Ao  ?MRN:  [id number] ? ?Subjective:   ?Patient is a 26 year old male who denies having previously diagnosed psychiatric history, who was admitted to the psychiatric hospital for evaluation and treatment of suicidal thoughts with intent and plan to jump off bridge, paranoia, and auditory hallucinations.  He was admitted from [facility], which provided medical clearance. ? 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 ?Yesterday the psychiatry team made the following recommendations: ?-Increase Zyprexa from 5 mg to 10 mg nightly for substance-induced psychotic disorder ?-Increase Lexapro from 5 mg to 10 mg once daily for depressive and anxious symptoms ?-Start CIWA for alcohol withdrawal ?-Nicotine replacement therapy ?-Order repeat labs for CMP abnormalities ?-Order agitation protocol ? ?On exam today, the patient was interviewed with interpreter.  Patient reports that he is still down depressed and sad.  He is tearful during the interview.  He reports feeling overwhelmed.  He reports auditory hallucinations continue to be intense and loud, and bother him.  He reports they cause him to feel overwhelmed.  Patient reports difficulty with middle insomnia due to auditory hallucinations.  Denies VH.  Denies CAH.  Reports he continues to have paranoid thoughts due to content of auditory hallucinations. ?He reports that appetite is up and down. ?Reports that concentration is poor due to negative content of auditory hallucinations. ?Patient continues to report having suicidal thoughts passive without intent or plan. ?Denies HI. ?Denies side effects to current psychiatric medications.  ?Patient was very overwhelmed during the interview and required as needed Zyprexa  2.5 mg once for anxiety and intense auditory hallucinations. ? ? ?Principal Problem: Psychoactive substance-induced psychosis (HCC) ?Diagnosis: Principal Problem: ?  Psychoactive substance-induced psychosis (HCC) ?Active Problems: ?  Adjustment disorder with mixed anxiety and depressed mood ?  Alcohol use disorder, severe, dependence (HCC) ?  Stimulant use disorder ? ?Total Time spent with patient: 20 minutes ? ?Past Psychiatric History:  ?Denies history of psychiatric diagnosis in the past ?Denies prior psychiatric hospitalization ?Denies history of suicide attempt ?Denies current psychiatric medication prescription ?Denies history of psychiatric medications ? ?Past Medical History: History reviewed. No pertinent past medical history. History reviewed. No pertinent surgical history. ?Family History: History reviewed. No pertinent family history. ? ?Family Psychiatric  History: Denies family history of psychiatric illness.  Denies family history of suicide attempt. ? ?Social History:  ?Social History  ? ?Substance and Sexual Activity  ?Alcohol Use Yes  ? Alcohol/week: 30.0 standard drinks  ? Types: 30 Cans of beer per week  ? Comment: 30 beers on the weekend  ?   ?Social History  ? ?Substance and Sexual Activity  ?Drug Use Yes  ? Types: Cocaine  ?  ?Social History  ? ?Socioeconomic History  ? Marital status: Single  ?  Spouse name: Not on file  ? Number of children: Not on file  ? Years of education: Not on file  ? Highest education level: Not on file  ?Occupational History  ? Not on file  ?Tobacco Use  ? Smoking status: Every Day  ?  Types: Cigarettes  ? Smokeless tobacco: Never  ?Vaping Use  ? Vaping Use: Never used  ?Substance and Sexual Activity  ? Alcohol use: Yes  ?  Alcohol/week: 30.0 standard drinks  ?  Types: 30 Cans of beer per week  ?  Comment: 30 beers on the weekend  ? Drug use: Yes  ?  Types: Cocaine  ? Sexual activity: Not on file  ?Other Topics Concern  ? Not on file  ?Social History Narrative  ? Not  on file  ? ?Social Determinants of Health  ? ?Financial Resource Strain: Not on file  ?Food Insecurity: Not on file  ?Transportation Needs: Not on file  ?Physical Activity: Not on file  ?Stress: Not on file  ?Social Connections: Not on file  ? ?Additional Social History:  ?  ?  ?  ?  ?  ?  ?  ?  ?  ?  ?  ? ?Sleep: Poor ? ?Appetite:  Fair ? ?Current Medications: ?Current Facility-Administered Medications  ?Medication Dose Route Frequency Provider Last Rate Last Admin  ? acetaminophen (TYLENOL) tablet 650 mg  650 mg Oral Q6H PRN Dahlia Byesnuoha, Josephine C, NP   650 mg at 06/19/21 1048  ? alum & mag hydroxide-simeth (MAALOX/MYLANTA) 200-200-20 MG/5ML suspension 30 mL  30 mL Oral Q6H PRN Dahlia Byesnuoha, Josephine C, NP      ? escitalopram (LEXAPRO) tablet 10 mg  10 mg Oral Daily Ayjah Show, Harrold DonathNathan, MD   10 mg at 06/20/21 0801  ? feeding supplement (ENSURE ENLIVE / ENSURE PLUS) liquid 237 mL  237 mL Oral BID BM Hill, Shelbie HutchingStephanie Leigh, MD   237 mL at 06/20/21 1119  ? folic acid (FOLVITE) tablet 1 mg  1 mg Oral Daily Onuoha, Josephine C, NP   1 mg at 06/20/21 0801  ? hydrOXYzine (ATARAX) tablet 25 mg  25 mg Oral Q6H PRN Phineas InchesMassengill, Tyquon Near, MD   25 mg at 06/20/21 0646  ? loperamide (IMODIUM) capsule 2-4 mg  2-4 mg Oral PRN Momina Hunton, Harrold DonathNathan, MD      ? LORazepam (ATIVAN) tablet 1 mg  1 mg Oral Q6H PRN Chelsy Parrales, MD      ? magnesium hydroxide (MILK OF MAGNESIA) suspension 30 mL  30 mL Oral Daily PRN Dahlia Byesnuoha, Josephine C, NP      ? multivitamin with minerals tablet 1 tablet  1 tablet Oral Daily Jevin Camino, MD   1 tablet at 06/20/21 0802  ? nicotine (NICODERM CQ - dosed in mg/24 hours) patch 14 mg  14 mg Transdermal Daily Roselle LocusHill, Stephanie Leigh, MD   14 mg at 06/20/21 0802  ? OLANZapine zydis (ZYPREXA) disintegrating tablet 10 mg  10 mg Oral QHS Debra Calabretta, MD   10 mg at 06/19/21 2104  ? ondansetron (ZOFRAN-ODT) disintegrating tablet 4 mg  4 mg Oral Q6H PRN Joriel Streety, Harrold DonathNathan, MD      ? thiamine tablet 100 mg  100 mg  Oral Daily Onuoha, Josephine C, NP   100 mg at 06/20/21 0802  ? traZODone (DESYREL) tablet 50 mg  50 mg Oral QHS PRN Dahlia Byesnuoha, Josephine C, NP   50 mg at 06/19/21 2104  ? ? ?Lab Results:  ?Results for orders placed or performed during the hospital encounter of 06/18/21 (from the past 48 hour(s))  ?Comprehensive metabolic panel     Status: Abnormal  ? Collection Time: 06/19/21  6:23 PM  ?Result Value Ref Range  ? Sodium 140 135 - 145 mmol/L  ? Potassium 3.7 3.5 - 5.1 mmol/L  ? Chloride 106 98 - 111 mmol/L  ? CO2 26 22 - 32 mmol/L  ? Glucose, Bld 85 70 - 99 mg/dL  ?  Comment: Glucose reference range applies only to samples taken after fasting for at least 8 hours.  ? BUN  17 6 - 20 mg/dL  ? Creatinine, Ser 0.84 0.61 - 1.24 mg/dL  ? Calcium 9.3 8.9 - 10.3 mg/dL  ? Total Protein 7.6 6.5 - 8.1 g/dL  ? Albumin 4.3 3.5 - 5.0 g/dL  ? AST 42 (H) 15 - 41 U/L  ? ALT 38 0 - 44 U/L  ? Alkaline Phosphatase 107 38 - 126 U/L  ? Total Bilirubin 0.4 0.3 - 1.2 mg/dL  ? GFR, Estimated >60 >60 mL/min  ?  Comment: (NOTE) ?Calculated using the CKD-EPI Creatinine Equation (2021) ?  ? Anion gap 8 5 - 15  ?  Comment: Performed at [facility], [address] [place], [place] [address]  ?TSH     Status: None  ? Collection Time: 06/19/21  6:23 PM  ?Result Value Ref Range  ? TSH 3.094 0.350 - 4.500 uIU/mL  ?  Comment: Performed by a 3rd Generation assay with a functional sensitivity of <=0.01 uIU/mL. ?Performed at [facility] [address] [place], [place] [address] ?  ?Lipid panel     Status: Abnormal  ? Collection Time: 06/19/21  6:23 PM  ?Result Value Ref Range  ? Cholesterol 196 0 - 200 mg/dL  ? Triglycerides 333 (H) <150 mg/dL  ? HDL 49 >40 mg/dL  ? Total CHOL/HDL Ratio 4.0 RATIO  ? VLDL 67 (H) 0 - 40 mg/dL  ? LDL Cholesterol 80 0 - 99 mg/dL  ?  Comment:        ?Total Cholesterol/HDL:CHD Risk ?Coronary Heart Disease Risk Table ?                    Men   Women ? 1/2 Average Risk   3.4   3.3 ? Average  Risk       5.0   4.4 ? 2 X Average Risk   9.6   7.1 ? 3 X Average Risk  23.4   11.0 ?       ?Use the calculated Patient Ratio ?above and the CHD Risk Table ?to determine the patient's CHD Risk. ?

## 2021-06-20 NOTE — [facility] Group Notes (Signed)
Adult Psychoeducational Group Note ? ?Date:  06/20/2021 ?Time:  3:09 PM ? ?Group Topic/Focus:  ?Wellness Toolbox:   The focus of this group is to discuss various aspects of wellness, balancing those aspects and exploring ways to increase the ability to experience wellness.  Patients will create a wellness toolbox for use upon discharge. ? ?Participation Level:  Active ? ?Participation Quality:  Attentive ? ?Affect:  Appropriate ? ?Cognitive:  Alert ? ?Insight: Appropriate ? ?Engagement in Group:  Engaged ? ?Modes of Intervention:  Activity ? ?Additional Comments:  Patient attended and participated in a relaxation group activity. ? ?Jearl Klinefelter ?06/20/2021, 3:09 PM ?

## 2021-06-21 DIAGNOSIS — F39 Unspecified mood [affective] disorder: Secondary | ICD-10-CM | POA: Diagnosis not present

## 2021-06-21 LAB — VITAMIN B1: Vitamin B1 (Thiamine): 144.3 nmol/L (ref 66.5–200.0)

## 2021-06-21 MED ORDER — VITAMIN D3 25 MCG PO TABS
1000.0000 [IU] | ORAL_TABLET | Freq: Every day | ORAL | Status: DC
  Administered 2021-06-21 – 2021-06-28 (×8): 1000 [IU] via ORAL
  Filled 2021-06-21 (×2): qty 7
  Filled 2021-06-21: qty 1
  Filled 2021-06-21: qty 7
  Filled 2021-06-21 (×7): qty 1

## 2021-06-21 MED ORDER — OLANZAPINE 7.5 MG PO TABS
15.0000 mg | ORAL_TABLET | Freq: Every day | ORAL | Status: DC
Start: 2021-06-21 — End: 2021-06-24
  Administered 2021-06-21 – 2021-06-23 (×3): 15 mg via ORAL
  Filled 2021-06-21 (×5): qty 2

## 2021-06-21 NOTE — [facility] Group Notes (Signed)
Adult Psychoeducational Group Note ? ?Date:  06/21/2021 ?Time:  3:36 PM ? ?Group Topic/Focus:  ?Goals Group:   The focus of this group is to help patients establish daily goals to achieve during treatment and discuss how the patient can incorporate goal setting into their daily lives to aide in recovery. ? ?Participation Level:  Active ? ?Participation Quality:  Appropriate ? ?Affect:  Appropriate ? ?Cognitive:  Appropriate ? ?Insight: Appropriate ? ?Engagement in Group:  Engaged ? ?Modes of Intervention:  Discussion ? ?Additional Comments:  Patient attended morning orientation group and participated. ? ?Tiphany Fayson W Molinda Bailiff ?[id number], 3:36 PM ?

## 2021-06-21 NOTE — Progress Notes (Signed)
Chaplain followed up with [facility].  He was still thinking about his mom and still unable to reach his family due to the loss of his phone in the ED.  He requested prayer again, which chaplain provided with the help of interpreter. ? ?[employer], Bcc ?Pager, [phone] ? ?

## 2021-06-21 NOTE — Progress Notes (Addendum)
[facility] MD Progress Note ? ?06/21/2021 11:36 AM ?Mayo Ao  ?MRN:  [id number] ?Subjective:   ?Patient is a 26 year old male who denies having previously diagnosed psychiatric history, who was admitted to the psychiatric hospital for evaluation and treatment of suicidal thoughts with intent and plan to jump off bridge, paranoia, and auditory hallucinations.  He was admitted from [facility], which provided medical clearance. ?  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  ?Yesterday the psychiatry team made the following recommendations: ?-Increase Zyprexa from 10 mg nightly -> to 2.5 mg in the morning and 12.5 mg qhs - for substance-induced psychotic disorder ?--Continue Lexapro 10 mg once daily for depressive and anxious symptoms ?-Continue CIWA for alcohol withdrawal ? ?On exam today, pt was seen with interpreter present. Pt reports he is feeling less depressed and more calm. On initial report, pt states he has no more intrusive thoughts or voices. On clarification, pt states that the voices have gotten calmer and less negative. Pt continues to report elevated feelings of anxiety and worry. He states his mood is "I still feel sad. Like it [the depression] gets me sometimes." Pt reports his appetite is there and he is eating good.  ?Pt reports having less suicidal thoughts, off and on, since evaluation yesterday.  ?Pt denies HI. ? ?When asked about side effects to medications pt reports "just that I am feeling more calm; they are working."  ? ?The psychiatry team discussed the opportunity for outpt rehab for pts substance use. Pt initially stated he thinks he can stop himself. "I have it in my heart to stop." Pt stated he didn't want rehab to affect his stay here because he needs to get back to work. Pt also  states he prefers and feels safer in this setting, doesn't want to be discharged yet. After clarification that outpt rehab would not affect his stay and is something he could do in an office setting after treatment here, pt reported he would think about it. ? ?Principal Problem: Psychoactive substance-induced psychosis (HCC) ?Diagnosis: Principal Problem: ?  Psychoactive substance-induced psychosis (HCC) ?Active Problems: ?  Adjustment disorder with mixed anxiety and depressed mood ?  Alcohol use disorder, severe, dependence (HCC) ?  Stimulant use disorder ? ?Total Time spent with patient: 20 minutes ? ?Past Psychiatric History:  ?Denies history of psychiatric diagnosis in the past ?Denies prior psychiatric hospitalization ?Denies history of suicide attempt ?Denies current psychiatric medication prescription ?Denies history of psychiatric medications ? ?Past Medical History: History reviewed. No pertinent past medical history. History reviewed. No pertinent surgical history. ?Family History: History reviewed. No pertinent family history. ? ?Family Psychiatric  History: Denies family history of psychiatric illness.  Denies family history of suicide attempt. ?  ?Social History:  ?Social History  ? ?Substance and Sexual Activity  ?Alcohol Use Yes  ? Alcohol/week: 30.0 standard drinks  ? Types: 30 Cans of beer per week  ? Comment: 30 beers on the weekend  ?   ?Social History  ? ?Substance and Sexual Activity  ?Drug Use Yes  ? Types: Cocaine  ?  ?Social History  ? ?Socioeconomic History  ? Marital status: Single  ?  Spouse name: Not on file  ? Number of children: Not on file  ? Years of education: Not on file  ? Highest education level: Not on file  ?Occupational History  ?  Not on file  ?Tobacco Use  ? Smoking status: Every Day  ?  Types: Cigarettes  ? Smokeless tobacco: Never  ?Vaping Use  ? Vaping Use: Never used  ?Substance and Sexual Activity  ? Alcohol use: Yes  ?  Alcohol/week: 30.0 standard drinks  ?  Types: 30  Cans of beer per week  ?  Comment: 30 beers on the weekend  ? Drug use: Yes  ?  Types: Cocaine  ? Sexual activity: Not on file  ?Other Topics Concern  ? Not on file  ?Social History Narrative  ? Not on file  ? ?Social Determinants of Health  ? ?Financial Resource Strain: Not on file  ?Food Insecurity: Not on file  ?Transportation Needs: Not on file  ?Physical Activity: Not on file  ?Stress: Not on file  ?Social Connections: Not on file  ? ?Additional Social History:  ?  ?Sleep: Fair ? ?Appetite:  Good ? ?Current Medications: ?Current Facility-Administered Medications  ?Medication Dose Route Frequency Provider Last Rate Last Admin  ? acetaminophen (TYLENOL) tablet 650 mg  650 mg Oral Q6H PRN Dahlia Byesnuoha, Josephine C, NP   650 mg at 06/19/21 1048  ? alum & mag hydroxide-simeth (MAALOX/MYLANTA) 200-200-20 MG/5ML suspension 30 mL  30 mL Oral Q6H PRN Dahlia Byesnuoha, Josephine C, NP      ? escitalopram (LEXAPRO) tablet 10 mg  10 mg Oral Daily Arleny Kruger, MD   10 mg at 06/21/21 0900  ? feeding supplement (ENSURE ENLIVE / ENSURE PLUS) liquid 237 mL  237 mL Oral BID BM Hill, Shelbie HutchingStephanie Leigh, MD   237 mL at 06/21/21 0920  ? folic acid (FOLVITE) tablet 1 mg  1 mg Oral Daily Onuoha, Josephine C, NP   1 mg at 06/21/21 0900  ? hydrOXYzine (ATARAX) tablet 25 mg  25 mg Oral Q6H PRN Phineas InchesMassengill, Haeley Fordham, MD   25 mg at 06/20/21 0646  ? loperamide (IMODIUM) capsule 2-4 mg  2-4 mg Oral PRN Jerrie Gullo, Harrold DonathNathan, MD      ? LORazepam (ATIVAN) tablet 1 mg  1 mg Oral Q6H PRN Luciano Cinquemani, MD      ? magnesium hydroxide (MILK OF MAGNESIA) suspension 30 mL  30 mL Oral Daily PRN Dahlia Byesnuoha, Josephine C, NP      ? multivitamin with minerals tablet 1 tablet  1 tablet Oral Daily Reatha Sur, MD   1 tablet at 06/21/21 0900  ? nicotine (NICODERM CQ - dosed in mg/24 hours) patch 14 mg  14 mg Transdermal Daily Hill, Shelbie HutchingStephanie Leigh, MD   14 mg at 06/21/21 0900  ? OLANZapine (ZYPREXA) injection 5 mg  5 mg Intramuscular TID PRN Jalonda Antigua, Harrold DonathNathan, MD       ? OLANZapine (ZYPREXA) tablet 15 mg  15 mg Oral QHS Braxson Hollingsworth, MD      ? OLANZapine (ZYPREXA) tablet 2.5 mg  2.5 mg Oral Daily Raylyn Carton, MD   2.5 mg at 06/21/21 0900  ? OLANZapine (ZYPREXA) tablet 5 mg  5 mg Oral TID PRN Phineas InchesMassengill, Fizza Scales, MD      ? ondansetron (ZOFRAN-ODT) disintegrating tablet 4 mg  4 mg Oral Q6H PRN Zissel Biederman, MD      ? thiamine tablet 100 mg  100 mg Oral Daily Onuoha, Josephine C, NP   100 mg at 06/21/21 0900  ? traZODone (DESYREL) tablet 50 mg  50 mg Oral QHS PRN Dahlia Byesnuoha, Josephine C, NP   50 mg at 06/20/21 2121  ? Vitamin D3 (Vitamin D) tablet 1,000 Units  1,000 Units  Oral Daily Phineas Inches, MD   1,000 Units at 06/21/21 9833  ? ? ?Lab Results:  ?Results for orders placed or performed during the hospital encounter of 06/18/21 (from the past 48 hour(s))  ?Comprehensive metabolic panel     Status: Abnormal  ? Collection Time: 06/19/21  6:23 PM  ?Result Value Ref Range  ? Sodium 140 135 - 145 mmol/L  ? Potassium 3.7 3.5 - 5.1 mmol/L  ? Chloride 106 98 - 111 mmol/L  ? CO2 26 22 - 32 mmol/L  ? Glucose, Bld 85 70 - 99 mg/dL  ?  Comment: Glucose reference range applies only to samples taken after fasting for at least 8 hours.  ? BUN 17 6 - 20 mg/dL  ? Creatinine, Ser 0.84 0.61 - 1.24 mg/dL  ? Calcium 9.3 8.9 - 10.3 mg/dL  ? Total Protein 7.6 6.5 - 8.1 g/dL  ? Albumin 4.3 3.5 - 5.0 g/dL  ? AST 42 (H) 15 - 41 U/L  ? ALT 38 0 - 44 U/L  ? Alkaline Phosphatase 107 38 - 126 U/L  ? Total Bilirubin 0.4 0.3 - 1.2 mg/dL  ? GFR, Estimated >60 >60 mL/min  ?  Comment: (NOTE) ?Calculated using the CKD-EPI Creatinine Equation (2021) ?  ? Anion gap 8 5 - 15  ?  Comment: Performed at [facility], [address], [place], [place] [address]  ?TSH     Status: None  ? Collection Time: 06/19/21  6:23 PM  ?Result Value Ref Range  ? TSH 3.094 0.350 - 4.500 uIU/mL  ?  Comment: Performed by a 3rd Generation assay with a functional sensitivity of <=0.01 uIU/mL. ?Performed  at [facility], [address], [place], [place] [address] ?  ?Lipid panel     Status: Abnormal  ? Collection Time: 06/19/21  6:23 PM  ?Result Value Ref Range  ? Cholesterol 196 0

## 2021-06-21 NOTE — Progress Notes (Signed)
Patient did not attend wrap up group. 

## 2021-06-21 NOTE — Plan of Care (Signed)
?  Problem: Coping: ?Goal: Will verbalize feelings ?Outcome: Progressing ?  ?Problem: Health Behavior/Discharge Planning: ?Goal: Compliance with prescribed medication regimen will improve ?Outcome: Progressing ?  ?Problem: Role Relationship: ?Goal: Ability to communicate needs accurately will improve ?Outcome: Progressing ?  ?Problem: Safety: ?Goal: Ability to remain free from injury will improve ?Outcome: Progressing ?  ?

## 2021-06-21 NOTE — Progress Notes (Signed)
Patient stated he felt tearful earlier today but feeling better this evening. Endorses Passive SI with no plan and verbal contracted for safety. Q 15 minutes safety checks ongoing without self harm. Patient remains safe.  ?

## 2021-06-21 NOTE — Progress Notes (Addendum)
Patient stated he slept fine last night and is feeling better today. Patient stated still having suicidal thoughts "sometimes" but denies any plan/intent. Patient stated seeing shadows and hearing things in the past but currently denies HI and A/V/H. Patient stated feeling much calmer although still anxious at times. Patient stated missing his family from [place] and stated having some cousins in [place] but no contact with them. He states he tends to feel alone but does have some friends. He expressed concerned regarding his job and not knowing if they are willing to take him back. 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 Patient was encouraged to contact his job to check in on his job status. Patient stated he had been sleeping in hotels due to his job and traveling. Plans to hopefully have a place to go after discharge. Patient stated he would contact his job and verbalized no further questions at this moment. Latest CIWA score 4. Absent from s/s of current distress.  ? ? 06/21/21 0900  ?Psych Admission Type (Psych Patients Only)  ?Admission Status Involuntary  ?Psychosocial Assessment  ?Patient Complaints Depression;Anxiety  ?Eye Contact Fair  ?Facial Expression Sad  ?Affect Depressed  ?Speech Logical/coherent  ?Interaction Assertive  ?Motor Activity Slow  ?Appearance/Hygiene Unremarkable  ?Behavior Characteristics Cooperative;Appropriate to situation  ?Mood Sad;Depressed  ?Thought Process  ?Coherency WDL  ?Content WDL  ?Delusions None reported or observed  ?Perception WDL  ?Hallucination None reported or observed  ?Judgment Limited  ?Confusion None  ?Danger to Self  ?Current suicidal ideation? Passive  ?Self-Injurious Behavior No self-injurious ideation or behavior indicators observed or expressed   ?Agreement Not to Harm Self Yes  ?Description of Agreement Verbal  ?Danger to Others  ?Danger to Others None reported or observed  ? ? ?

## 2021-06-22 LAB — GLUCOSE, CAPILLARY: Glucose-Capillary: 147 mg/dL — ABNORMAL HIGH (ref 70–99)

## 2021-06-22 NOTE — Plan of Care (Signed)
?  Problem: Education: ?Goal: Knowledge of the prescribed therapeutic regimen will improve ?Outcome: Progressing ?  ?Problem: Health Behavior/Discharge Planning: ?Goal: Compliance with prescribed medication regimen will improve ?Outcome: Progressing ?  ?Problem: Safety: ?Goal: Ability to remain free from injury will improve ?Outcome: Progressing ?  ?Problem: Education: ?Goal: Mental status will improve ?Outcome: Progressing ?  ?

## 2021-06-22 NOTE — Progress Notes (Signed)
[facility] MD Progress Note ? ?06/22/2021 1:25 PM ?Raymond [name]  ?MRN:  [id number] ?Subjective:   ?Patient is a 26 year old male who denies having previously diagnosed psychiatric history, who was admitted to the psychiatric hospital for evaluation and treatment of suicidal thoughts with intent and plan to jump off bridge, paranoia, and auditory hallucinations.  He was admitted from [facility], which provided medical clearance. ?  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  ?Yesterday the psychiatry team made the following recommendations: ?-Increase Zyprexa from 10 mg nightly -> to 2.5 mg in the morning and 12.5 mg qhs - for substance-induced psychotic disorder ?--Continue Lexapro 10 mg once daily for depressive and anxious symptoms ?-Continue CIWA for alcohol withdrawal ? ?On exam today, pt was seen with interpreter present. Pt reports he is feeling less depressed and more calm. On initial report, pt states he has no more intrusive thoughts or voices. On clarification, pt states that the voices have gotten calmer and less negative. Pt continues to report elevated feelings of anxiety and worry. He states his mood is "I still feel sad. Like it [the depression] gets me sometimes." Pt reports his appetite is there and he is eating good.  ?Pt reports having less suicidal thoughts, off and on, since evaluation yesterday.  ?Pt denies HI. ? ?When asked about side effects to medications pt reports "just that I am feeling more calm; they are working."  ? ?The psychiatry team discussed the opportunity for outpt rehab for pts substance use. Pt initially stated he thinks he can stop himself. "I have it in my heart to stop." Pt stated he didn't want rehab to affect his stay here because he needs to get back to work. Pt also  states he prefers and feels safer in this setting, doesn't want to be discharged yet. After clarification that outpt rehab would not affect his stay and is something he could do in an office setting after treatment here, pt reported he would think about it. ? ?Principal Problem: Psychoactive substance-induced psychosis (HCC) ?Diagnosis: Principal Problem: ?  Psychoactive substance-induced psychosis (HCC) ?Active Problems: ?  Adjustment disorder with mixed anxiety and depressed mood ?  Alcohol use disorder, severe, dependence (HCC) ?  Stimulant use disorder ? ?Total Time spent with patient: 20 minutes ? ?Past Psychiatric History:  ?Denies history of psychiatric diagnosis in the past ?Denies prior psychiatric hospitalization ?Denies history of suicide attempt ?Denies current psychiatric medication prescription ?Denies history of psychiatric medications ? ?Past Medical History: History reviewed. No pertinent past medical history. History reviewed. No pertinent surgical history. ?Family History: History reviewed. No pertinent family history. ? ?Family Psychiatric  History: Denies family history of psychiatric illness.  Denies family history of suicide attempt. ?  ?Social History:  ?Social History  ? ?Substance and Sexual Activity  ?Alcohol Use Yes  ? Alcohol/week: 30.0 standard drinks  ? Types: 30 Cans of beer per week  ? Comment: 30 beers on the weekend  ?   ?Social History  ? ?Substance and Sexual Activity  ?Drug Use Yes  ? Types: Cocaine  ?  ?Social History  ? ?Socioeconomic History  ? Marital status: Single  ?  Spouse name: Not on file  ? Number of children: Not on file  ? Years of education: Not on file  ? Highest education level: Not on file  ?Occupational History  ?  Not on file  ?Tobacco Use  ? Smoking status: Every Day  ?  Types: Cigarettes  ? Smokeless tobacco: Never  ?Vaping Use  ? Vaping Use: Never used  ?Substance and Sexual Activity  ? Alcohol use: Yes  ?  Alcohol/week: 30.0 standard drinks  ?  Types: 30  Cans of beer per week  ?  Comment: 30 beers on the weekend  ? Drug use: Yes  ?  Types: Cocaine  ? Sexual activity: Not on file  ?Other Topics Concern  ? Not on file  ?Social History Narrative  ? Not on file  ? ?Social Determinants of Health  ? ?Financial Resource Strain: Not on file  ?Food Insecurity: Not on file  ?Transportation Needs: Not on file  ?Physical Activity: Not on file  ?Stress: Not on file  ?Social Connections: Not on file  ? ?Additional Social History:  ?  ?Sleep: Fair ? ?Appetite:  Good ? ?Current Medications: ?Current Facility-Administered Medications  ?Medication Dose Route Frequency Provider Last Rate Last Admin  ? acetaminophen (TYLENOL) tablet 650 mg  650 mg Oral Q6H PRN Dahlia Byesnuoha, Josephine C, NP   650 mg at 06/19/21 1048  ? alum & mag hydroxide-simeth (MAALOX/MYLANTA) 200-200-20 MG/5ML suspension 30 mL  30 mL Oral Q6H PRN Dahlia Byesnuoha, Josephine C, NP      ? escitalopram (LEXAPRO) tablet 10 mg  10 mg Oral Daily Massengill, Harrold DonathNathan, MD   10 mg at 06/22/21 [id number]  ? feeding supplement (ENSURE ENLIVE / ENSURE PLUS) liquid 237 mL  237 mL Oral BID BM Hill, Shelbie HutchingStephanie Leigh, MD   237 mL at 06/22/21 0933  ? folic acid (FOLVITE) tablet 1 mg  1 mg Oral Daily Onuoha, Josephine C, NP   1 mg at 06/22/21 [id number]  ? magnesium hydroxide (MILK OF MAGNESIA) suspension 30 mL  30 mL Oral Daily PRN Dahlia Byesnuoha, Josephine C, NP      ? multivitamin with minerals tablet 1 tablet  1 tablet Oral Daily Massengill, Nathan, MD   1 tablet at 06/22/21 [id number]  ? nicotine (NICODERM CQ - dosed in mg/24 hours) patch 14 mg  14 mg Transdermal Daily Roselle LocusHill, Stephanie Leigh, MD   14 mg at 06/22/21 [id number]  ? OLANZapine (ZYPREXA) injection 5 mg  5 mg Intramuscular TID PRN Massengill, Harrold DonathNathan, MD      ? OLANZapine (ZYPREXA) tablet 15 mg  15 mg Oral QHS Phineas InchesMassengill, Nathan, MD   15 mg at 06/21/21 2138  ? OLANZapine (ZYPREXA) tablet 2.5 mg  2.5 mg Oral Daily Massengill, Nathan, MD   2.5 mg at 06/22/21 0931  ? OLANZapine (ZYPREXA) tablet 5 mg  5 mg Oral TID PRN  Massengill, Harrold DonathNathan, MD      ? thiamine tablet 100 mg  100 mg Oral Daily Onuoha, Josephine C, NP   100 mg at 06/22/21 [id number]  ? traZODone (DESYREL) tablet 50 mg  50 mg Oral QHS PRN Dahlia Byesnuoha, Josephine C, NP   50 mg at 06/21/21 2142  ? Vitamin D3 (Vitamin D) tablet 1,000 Units  1,000 Units Oral Daily Phineas InchesMassengill, Nathan, MD   1,000 Units at 06/22/21 [id number]  ? ? ?Lab Results:  ?No results found for this or any previous visit (from the past 48 hour(s)). ? ? ?Blood Alcohol level:  ?Lab Results  ?Component Value Date  ? ETH <10 06/15/2021  ? ? ?Metabolic Disorder Labs: ?Lab Results  ?Component Value Date  ? HGBA1C 5.4 06/19/2021  ? MPG 108.28 06/19/2021  ? ?No results found for: PROLACTIN ?Lab Results  ?  Component Value Date  ? CHOL 196 06/19/2021  ? TRIG 333 (H) 06/19/2021  ? HDL 49 06/19/2021  ? CHOLHDL 4.0 06/19/2021  ? VLDL 67 (H) 06/19/2021  ? LDLCALC 80 06/19/2021  ? ? ?Physical Findings: ?AIMS: Facial and Oral Movements ?Muscles of Facial Expression: None, normal ?Lips and Perioral Area: None, normal ?Jaw: None, normal ?Tongue: None, normal,Extremity Movements ?Upper (arms, wrists, hands, fingers): None, normal ?Lower (legs, knees, ankles, toes): None, normal, Trunk Movements ?Neck, shoulders, hips: None, normal, Overall Severity ?Severity of abnormal movements (highest score from questions above): None, normal ?Incapacitation due to abnormal movements: None, normal ?Patient's awareness of abnormal movements (rate only patient's report): No Awareness, Dental Status ?Current problems with teeth and/or dentures?: No ?Does patient usually wear dentures?: No  ?CIWA:  CIWA-Ar Total: 0 ?COWS:    ? ?Musculoskeletal: ?Strength & Muscle Tone: within normal limits ?Gait & Station: normal ?Patient leans: N/A ? ?Psychiatric Specialty Exam: ? ?Presentation  ?General Appearance: Casual ? ?Eye Contact:Good ? ?Speech:Normal Rate ? ?Speech Volume:Normal ? ?Handedness:Right ? ? ?Mood and Affect  ?Mood:Anxious;  Depressed ? ?Affect:Restricted ? ? ?Thought Process  ?Thought Processes:Linear ? ?Descriptions of Associations:Intact ? ?Orientation:Full (Time, Place and Person) ? ?Thought Content:Logical; Paranoid Ideation ? ?History of Schizophrenia/

## 2021-06-22 NOTE — [facility] Group Notes (Signed)
Adult Psychoeducational Group Note ? ?Date:  06/22/2021 ?Time:  2:43 PM ? ?Group Topic/Focus:  ?Wellness Toolbox:   The focus of this group is to discuss various aspects of wellness, balancing those aspects and exploring ways to increase the ability to experience wellness.  Patients will create a wellness toolbox for use upon discharge. ? ?Participation Level:  Active ? ?Participation Quality:  Attentive ? ?Affect:  Appropriate ? ?Cognitive:  Alert ? ?Insight: Appropriate ? ?Engagement in Group:  Engaged ? ?Modes of Intervention:  Activity ? ?Additional Comments:  Patient attended and participated in the relaxation group activity. ? ?Jearl Klinefelter ?06/22/2021, 2:43 PM ?

## 2021-06-22 NOTE — Progress Notes (Signed)
Patient is A&Ox4. Stated he slept better and is feeling like his medications are really helping him. Patient stated still having passive SI but that the voices are not as strong anymore. Pt stated sometimes he tends to focus on negative thoughts when he is alone and his goal for today is learning how to cope and turn those negative thoughts into positive ones. Patient denies any pain and states his anxiety is improving although still feel anxious from time to time. Patient requested to be allowed to gain access to his phone that was found in the ED in order to get access to work and family contacts. MD notified and made aware. Patient med compliant and verbalized no further concerns at this moment.  ? ? 06/22/21 0926  ?Psych Admission Type (Psych Patients Only)  ?Admission Status Voluntary  ?Psychosocial Assessment  ?Patient Complaints Depression  ?Eye Contact Fair  ?Facial Expression Sad  ?Affect Depressed  ?Speech Logical/coherent  ?Interaction Assertive  ?Motor Activity Slow  ?Appearance/Hygiene Unremarkable  ?Behavior Characteristics Cooperative;Appropriate to situation  ?Mood Depressed  ?Thought Process  ?Coherency WDL  ?Content WDL  ?Delusions None reported or observed  ?Perception WDL  ?Hallucination None reported or observed  ?Judgment Limited  ?Confusion None  ?Danger to Self  ?Current suicidal ideation? Passive  ?Self-Injurious Behavior No self-injurious ideation or behavior indicators observed or expressed   ?Agreement Not to Harm Self Yes  ?Description of Agreement Verbal  ?Danger to Others  ?Danger to Others None reported or observed  ? ? ?

## 2021-06-22 NOTE — [facility] Group Notes (Signed)
Pt attended group but needs an interpreter. ?

## 2021-06-22 NOTE — Progress Notes (Signed)
Patient compliant with medications, endorses Passive SI with no plan and verbal contracted for safety. Support and encouragement provided.  ?

## 2021-06-22 NOTE — Group Note (Signed)
Date:  06/22/2021 ?Time:  10:44 AM ? ?Group Topic/Focus:  ?Orientation:   The focus of this group is to educate the patient on the purpose and policies of crisis stabilization and provide a format to answer questions about their admission.  The group details unit policies and expectations of patients while admitted. ? ? ? ?Participation Level:  Minimal ? ?Participation Quality:  Appropriate ? ?Affect:  Appropriate ? ?Cognitive:  Appropriate ? ?Insight: Appropriate ? ?Engagement in Group:  Limited ? ?Modes of Intervention:  Discussion ? ?Additional Comments:   ? ?Jaquita Rector ?06/22/2021, 10:44 AM ? ?

## 2021-06-22 NOTE — Group Note (Signed)
LCSW Group Therapy Note ? ? ?Group Date: 06/22/2021 ?Start Time: 1300 ?End Time: 1400 ? ?Type of Therapy and Topic:  Group Therapy - Healthy vs Unhealthy Coping Skills ? ?Participation Level:  Active  ? ?Description of Group ?The focus of this group was to determine what unhealthy coping techniques typically are used by group members and what healthy coping techniques would be helpful in coping with various problems. Patients were guided in becoming aware of the differences between healthy and unhealthy coping techniques. Patients were asked to identify 2-3 healthy coping skills they would like to learn to use more effectively. ? ?Therapeutic Goals ?Patients learned that coping is what human beings do all day long to deal with various situations in their lives ?Patients defined and discussed healthy vs unhealthy coping techniques ?Patients identified their preferred coping techniques and identified whether these were healthy or unhealthy ?Patients determined 2-3 healthy coping skills they would like to become more familiar with and use more often. ?Patients provided support and ideas to each other ? ? ?Summary of Patient Progress:  Groups did not occur due to staffing challenges.  A packet that included worksheets and information regarding coping skills was provided to the Pt.  The Pt was given time to ask questions and express any concerns with the CSW.   ? ? ?Therapeutic Modalities ?Cognitive Behavioral Therapy ?Motivational Interviewing ? ?Kymberlie Brazeau M Anahit Klumb, LCSWA ?06/22/2021  1:50 PM   ? ?

## 2021-06-23 NOTE — Progress Notes (Signed)
Writer had translator to speak to patient. He was informed of his scheduled medication which he was compliant. He reported that he heard voices in the am but none since then. He did ask when he would be discharged and he was encouraged to ask his doctor on tomorrow. He denies having pain. Support given and safety maintained with 15 min checks.  ?

## 2021-06-23 NOTE — Group Note (Signed)
LCSW Group Therapy Note ? ?No social work group was held today due to staffing of one social worker on Adult unit and  high number of admissions that required initial psychosocial assessments.  The following was provided to the patient in lieu of in-person group: ? ?Healthy vs. Unhealthy Supports and Coping Skills ? ? ?Unhealthy                                                              Healthy ?Works (at first) Works   ?Stops working or starts hurting Continues working  ?Fast Usually takes time to develop  ?Easy Often difficult to learn  ?Usually a habit Usually unknown, has to become a habit  ?Can do alone Often need to reach out for help   ?Leads to loss Leads to gain  ?   ?   ? ?My Unhealthy Coping Skills                                    My Healthy Coping Skills ?   ?   ?   ?   ?   ?   ?   ? ?My Unhealthy Supports                                           My Healthy Supports ?   ?   ?   ?   ?   ?   ?   ? ? ? ? ? ?Use the stationery provided to write a Goodbye Letter to one of your unhealthy coping skills or unhealthy supports. ? ?Share with other patients as you desire. ? ?Karessa Onorato Grossman-Orr, LCSW ?06/23/2021  ?3:38 PM  ?   ?

## 2021-06-23 NOTE — Progress Notes (Signed)
D. Pt presented with a anxious affect/mood and per translator, pt reported that his AH have improved, and that his medication is improving.  Per self inventory, pt rated his depression, hopelessness and anxiety a 4/5/3, respectively. Pt wrote that his goal was to take his medications so that he would feel calmer. Pt currently denies SI/HI and AVH ?A. Labs and vitals monitored. Pt given and educated on medications. Pt supported emotionally and encouraged to express concerns and ask questions.   ?R. Pt remains safe with 15 minute checks. Will continue POC. ? ?  ?

## 2021-06-23 NOTE — Progress Notes (Addendum)
[facility] MD Progress Note ? ?06/23/2021 12:46 PM ?Mayo Ao  ?MRN:  [id number] ? ?Subjective:  "I feel safe because I do not think people are following me anymore. I do not feel suicidal anymore. I think the medications are helping me." ? ?Brief History: Patient is a 26 year old male who denies having previously diagnosed psychiatric history, who was admitted to the psychiatric hospital for evaluation and treatment of suicidal thoughts with intent and plan to jump off bridge, paranoia, and auditory hallucinations.  He was admitted from [facility], which provided medical clearance. ? ?Daily Notes 06/23/21: During my assessment today, with interpreter, patient is sitting calmly in a chair. Chart reviewed and findings shared with the tx team and discussed with Dr. Mason Jim. Alert and oriented to person, time, place and situation. Speech is fluent, with normal pattern and volume in Spanish. Mood/Affect is appropriate, anxious and depressed. Thought process is coherent and linear and thought content is logical and WNL. Memory, judgement and insight fair. Actively attending therapeutic milieu and group activities. Taking prescribed medications without any adverse effects. ? ?Denied SI/HI/AVH and paranoia, added, "I am not hearing any voices anymore and feel safe because I do not feel anyone is following me. Endorsed sleeping for 9 hours last night, great appetite and taking medications as ordered. Reviewed routine labs include triglyceride 333, VLDL 67, and capillary blood glucose 147. Instruct patient to exercise regularly, and to aim for at least 30 minutes of physical activity on most or all days of the week. Avoid sugar and refined he drinks. AST 42. Continues on CIWA protocol. ? ?Principal Problem: Psychoactive substance-induced psychosis (HCC) ? ?Diagnosis: Principal Problem: ?  Psychoactive substance-induced psychosis (HCC) ?Active Problems: ?  Adjustment disorder with mixed anxiety and depressed  mood ?  Alcohol use disorder, severe, dependence (HCC) ?  Stimulant use disorder ? ?Total Time spent with patient: 30 minutes ? ?Past Psychiatric History: Denies history of psychiatric diagnosis in the past ?Denies prior psychiatric hospitalization ?Denies history of suicide attempt ?Denies current psychiatric medication prescription ?Denies history of psychiatric medications ?  ? ?Past Medical History: History reviewed. No pertinent past medical history. History reviewed. No pertinent surgical history. ? ?Family History: History reviewed. No pertinent family history. ? ?Family Psychiatric  History:  Denies family history of psychiatric illness.  Denies family history of suicide attempt.  ? ?Social History:  ?Social History  ? ?Substance and Sexual Activity  ?Alcohol Use Yes  ? Alcohol/week: 30.0 standard drinks  ? Types: 30 Cans of beer per week  ? Comment: 30 beers on the weekend  ?   ?Social History  ? ?Substance and Sexual Activity  ?Drug Use Yes  ? Types: Cocaine  ?  ?Social History  ? ?Socioeconomic History  ? Marital status: Single  ?  Spouse name: Not on file  ? Number of children: Not on file  ? Years of education: Not on file  ? Highest education level: Not on file  ?Occupational History  ? Not on file  ?Tobacco Use  ? Smoking status: Every Day  ?  Types: Cigarettes  ? Smokeless tobacco: Never  ?Vaping Use  ? Vaping Use: Never used  ?Substance and Sexual Activity  ? Alcohol use: Yes  ?  Alcohol/week: 30.0 standard drinks  ?  Types: 30 Cans of beer per week  ?  Comment: 30 beers on the weekend  ? Drug use: Yes  ?  Types: Cocaine  ? Sexual activity: Not on file  ?Other Topics  Concern  ? Not on file  ?Social History Narrative  ? Not on file  ? ?Social Determinants of Health  ? ?Financial Resource Strain: Not on file  ?Food Insecurity: Not on file  ?Transportation Needs: Not on file  ?Physical Activity: Not on file  ?Stress: Not on file  ?Social Connections: Not on file  ? ?Additional Social History:  ?   ?Sleep: Good ? ?Appetite:  Good ? ?Current Medications: ?Current Facility-Administered Medications  ?Medication Dose Route Frequency Provider Last Rate Last Admin  ? acetaminophen (TYLENOL) tablet 650 mg  650 mg Oral Q6H PRN Dahlia Byesnuoha, Josephine C, NP   650 mg at 06/19/21 1048  ? alum & mag hydroxide-simeth (MAALOX/MYLANTA) 200-200-20 MG/5ML suspension 30 mL  30 mL Oral Q6H PRN Dahlia Byesnuoha, Josephine C, NP      ? escitalopram (LEXAPRO) tablet 10 mg  10 mg Oral Daily Massengill, Harrold DonathNathan, MD   10 mg at 06/23/21 0820  ? feeding supplement (ENSURE ENLIVE / ENSURE PLUS) liquid 237 mL  237 mL Oral BID BM Hill, Shelbie HutchingStephanie Leigh, MD   237 mL at 06/22/21 0933  ? folic acid (FOLVITE) tablet 1 mg  1 mg Oral Daily Onuoha, Josephine C, NP   1 mg at 06/23/21 0820  ? magnesium hydroxide (MILK OF MAGNESIA) suspension 30 mL  30 mL Oral Daily PRN Dahlia Byesnuoha, Josephine C, NP      ? multivitamin with minerals tablet 1 tablet  1 tablet Oral Daily Massengill, Nathan, MD   1 tablet at 06/23/21 0818  ? nicotine (NICODERM CQ - dosed in mg/24 hours) patch 14 mg  14 mg Transdermal Daily Roselle LocusHill, Stephanie Leigh, MD   14 mg at 06/23/21 [id number]  ? OLANZapine (ZYPREXA) injection 5 mg  5 mg Intramuscular TID PRN Massengill, Harrold DonathNathan, MD      ? OLANZapine (ZYPREXA) tablet 15 mg  15 mg Oral QHS Phineas InchesMassengill, Nathan, MD   15 mg at 06/22/21 2155  ? OLANZapine (ZYPREXA) tablet 2.5 mg  2.5 mg Oral Daily Massengill, Nathan, MD   2.5 mg at 06/23/21 0818  ? OLANZapine (ZYPREXA) tablet 5 mg  5 mg Oral TID PRN Massengill, Harrold DonathNathan, MD      ? thiamine tablet 100 mg  100 mg Oral Daily Onuoha, Josephine C, NP   100 mg at 06/23/21 0818  ? traZODone (DESYREL) tablet 50 mg  50 mg Oral QHS PRN Dahlia Byesnuoha, Josephine C, NP   50 mg at 06/22/21 2155  ? Vitamin D3 (Vitamin D) tablet 1,000 Units  1,000 Units Oral Daily Phineas InchesMassengill, Nathan, MD   1,000 Units at 06/23/21 0818  ? ? ?Lab Results:  ?Results for orders placed or performed during the hospital encounter of 06/18/21 (from the past 48 hour(s))   ?Glucose, capillary     Status: Abnormal  ? Collection Time: 06/22/21  7:50 PM  ?Result Value Ref Range  ? Glucose-Capillary 147 (H) 70 - 99 mg/dL  ?  Comment: Glucose reference range applies only to samples taken after fasting for at least 8 hours.  ? ? ?Blood Alcohol level:  ?Lab Results  ?Component Value Date  ? ETH <10 06/15/2021  ? ? ?Metabolic Disorder Labs: ?Lab Results  ?Component Value Date  ? HGBA1C 5.4 06/19/2021  ? MPG 108.28 06/19/2021  ? ?No results found for: PROLACTIN ?Lab Results  ?Component Value Date  ? CHOL 196 06/19/2021  ? TRIG 333 (H) 06/19/2021  ? HDL 49 06/19/2021  ? CHOLHDL 4.0 06/19/2021  ? VLDL 67 (H) 06/19/2021  ?  LDLCALC 80 06/19/2021  ? ? ?Physical Findings: ?AIMS: Facial and Oral Movements ?Muscles of Facial Expression: None, normal ?Lips and Perioral Area: None, normal ?Jaw: None, normal ?Tongue: None, normal,Extremity Movements ?Upper (arms, wrists, hands, fingers): None, normal ?Lower (legs, knees, ankles, toes): None, normal, Trunk Movements ?Neck, shoulders, hips: None, normal, Overall Severity ?Severity of abnormal movements (highest score from questions above): None, normal ?Incapacitation due to abnormal movements: None, normal ?Patient's awareness of abnormal movements (rate only patient's report): No Awareness, Dental Status ?Current problems with teeth and/or dentures?: No ?Does patient usually wear dentures?: No  ?CIWA:  CIWA-Ar Total: 0 ?COWS:    ? ?Musculoskeletal: ?Strength & Muscle Tone: within normal limits ?Gait & Station: normal ?Patient leans: N/A ? ?Psychiatric Specialty Exam: ? ?Presentation  ?General Appearance: Appropriate for Environment; Casual; Fairly Groomed ? ?Eye Contact:Fair ? ?Speech:Clear and Coherent; Normal Rate (Interpreter Present) ? ?Speech Volume:Normal ? ?Handedness:Right ? ?Mood and Affect  ?Mood:Anxious; Depressed ? ?Affect:Appropriate; Depressed ? ?Thought Process  ?Thought Processes:Coherent; Linear ? ?Descriptions of  Associations:Intact ? ?Orientation:Full (Time, Place and Person) ? ?Thought Content:Logical; WDL ? ?History of Schizophrenia/Schizoaffective disorder:No ? ?Duration of Psychotic Symptoms:N/A ? ?Hallucinations:Hallucinations: None ?

## 2021-06-23 NOTE — [facility] Group Notes (Signed)
Goals Group ?4/29//2023 ? ? ?Group Focus: affirmation, clarity of thought, and goals/reality orientation ?Treatment Modality:  Psychoeducation ?Interventions utilized were assignment, group exercise, and support ?Purpose: To be able to understand and verbalize the reason for their admission to the hospital. To understand that the medication helps with their chemical imbalance but they also need to work on their choices in life. To be challenged to develop a list of 30 positives about themselves. Also introduce the concept that "feelings" are not reality. ? ?Participation Level:  Active ? ?Participation Quality:  Appropriate ? ?Affect:  Appropriate ? ?Cognitive:  Appropriate ? ?Insight:  Improving ? ?Engagement in Group:  Engaged ? ?Additional Comments:  Attended with his interpreter. Participated in the group.  ? ?Raymond [name] A ?

## 2021-06-23 NOTE — [facility] Group Notes (Signed)
.  Psychoeducational Group Note ? ? ? ?Date:  4/29//23 ?Time: 1300-1400 ? ? ? ?Purpose of Group: . The group focus' on teaching patients on how to identify their needs and their Life Skills:  A group where two lists are made. What people need and what are things that we do that are unhealthy. The lists are developed by the patients and it is explained that we often do the actions that are not healthy to get our list of needs met. ? ?Goal:: to develop the coping skills needed to get their needs met ? ?Participation Level:  Active ? ?Participation Quality:  Appropriate ? ?Affect:  Appropriate ? ?Cognitive:  Oriented ? ?Insight:  Improving ? ?Engagement in Group:  Engaged ? ?Additional Comments: Rates energy at a 7/10 ? ?Bryson Dames A ? ?

## 2021-06-24 DIAGNOSIS — F19959 Other psychoactive substance use, unspecified with psychoactive substance-induced psychotic disorder, unspecified: Secondary | ICD-10-CM

## 2021-06-24 LAB — COMPREHENSIVE METABOLIC PANEL
ALT: 103 U/L — ABNORMAL HIGH (ref 0–44)
AST: 57 U/L — ABNORMAL HIGH (ref 15–41)
Albumin: 4.1 g/dL (ref 3.5–5.0)
Alkaline Phosphatase: 80 U/L (ref 38–126)
Anion gap: 9 (ref 5–15)
BUN: 12 mg/dL (ref 6–20)
CO2: 26 mmol/L (ref 22–32)
Calcium: 9.8 mg/dL (ref 8.9–10.3)
Chloride: 106 mmol/L (ref 98–111)
Creatinine, Ser: 0.76 mg/dL (ref 0.61–1.24)
GFR, Estimated: >60 mL/min (ref >60)
Glucose, Bld: 107 mg/dL — ABNORMAL HIGH (ref 70–99)
Potassium: 4.2 mmol/L (ref 3.5–5.1)
Sodium: 141 mmol/L (ref 135–145)
Total Bilirubin: 0.6 mg/dL (ref 0.3–1.2)
Total Protein: 7.4 g/dL (ref 6.5–8.1)

## 2021-06-24 LAB — HEPATITIS PANEL, ACUTE
HCV Ab: NONREACTIVE
Hep A IgM: NONREACTIVE
Hep B C IgM: NONREACTIVE
Hepatitis B Surface Ag: NONREACTIVE

## 2021-06-24 LAB — LIPID PANEL
Cholesterol: 334 mg/dL — ABNORMAL HIGH (ref 0–200)
HDL: 48 mg/dL (ref >40)
LDL Cholesterol: UNDETERMINED mg/dL (ref 0–99)
Total CHOL/HDL Ratio: 7 RATIO
Triglycerides: 657 mg/dL — ABNORMAL HIGH (ref <150)
VLDL: UNDETERMINED mg/dL (ref 0–40)

## 2021-06-24 LAB — LDL CHOLESTEROL, DIRECT: Direct LDL: 151.7 mg/dL — ABNORMAL HIGH (ref 0–99)

## 2021-06-24 MED ORDER — LORAZEPAM 1 MG PO TABS: 1.0000 mg | ORAL_TABLET | ORAL | Status: DC | PRN

## 2021-06-24 MED ORDER — RISPERIDONE 2 MG PO TBDP: 2.0000 mg | ORAL_TABLET | Freq: Three times a day (TID) | ORAL | Status: DC | PRN

## 2021-06-24 MED ORDER — FENOFIBRATE 54 MG PO TABS: 54.0000 mg | ORAL_TABLET | Freq: Every day | ORAL | Status: DC

## 2021-06-24 MED ORDER — ZIPRASIDONE MESYLATE 20 MG IM SOLR: 20.0000 mg | INTRAMUSCULAR | Status: DC | PRN

## 2021-06-24 MED ORDER — FENOFIBRATE 54 MG PO TABS
54.0000 mg | ORAL_TABLET | Freq: Every day | ORAL | Status: DC
  Administered 2021-06-24 – 2021-06-28 (×5): 54 mg via ORAL
  Filled 2021-06-24: qty 1
  Filled 2021-06-24: qty 7
  Filled 2021-06-24: qty 1
  Filled 2021-06-24: qty 7
  Filled 2021-06-24 (×3): qty 1

## 2021-06-24 MED ORDER — FENOFIBRATE 54 MG PO TABS
54.0000 mg | ORAL_TABLET | Freq: Every day | ORAL | Status: DC
  Filled 2021-06-24 (×2): qty 1

## 2021-06-24 MED ORDER — OLANZAPINE 7.5 MG PO TABS
7.5000 mg | ORAL_TABLET | Freq: Every day | ORAL | Status: DC
  Administered 2021-06-24 – 2021-06-25 (×2): 7.5 mg via ORAL
  Filled 2021-06-24 (×3): qty 1

## 2021-06-24 NOTE — Group Note (Signed)
[facility] LCSW Group Therapy Note ? ?Date/Time:  06/24/2021   ? ?Type of Therapy and Topic:  Group Therapy:  Using Music to Encourage Yourself ? ?Participation Level:  Active  ? ?Description of Group: ?In this process group, members listened to a variety of music through choosing from CSW's list #1 through #25.  Patients identified the messages received from those songs and how the music affected their emotions.  Patients were encouraged to use music as a coping skill at home, but to be mindful of the choices made.  Patients discussed how this knowledge can help with wellness and recovery in various ways including managing depression and anxiety as well as encouraging healthy sleep habits.   ? ?Therapeutic Goals: ?Patients will explore the impact of different songs on mood ?Patients will verbalize the thoughts they have when listening to different types of music ?Patients will identify music that is soothing to them as well as music that is energizing to them ?Patients will discuss how to use this knowledge to assist in maintaining wellness and recovery ?Patients will explore the use of music as a coping skill ?Patients will encourage one another ? ?Summary of Patient Progress:  At the beginning of group, patient (with the help of interpreter) expressed his mood is most positively affected by [place] rap music.  He participated fully and enjoyed the music. ? ?Therapeutic Modalities: ?Solution Focused Brief Therapy ?Activity ? ? ?Ambrose Mantle, LCSW ?  ?

## 2021-06-24 NOTE — Consult Note (Addendum)
Called by [facility] for concern of hypertriglyceridemia. He was recently started on zyprexa and has had a sharp incline in his triglycerides. It is also noted that he has had an increase in his LFTs. After review with pharmacy, we have no available medications that do not have the potential to raise LFTs while treating hypertriglyceridemia. It would be more appropriate for psychiatry to review their treatment options for his psychiatric condition. In reviewing his treatment options, I recommend that they review medications w/ the available pharmacy team for potential ADEs. In light of the above, can consider starting low dose finofibrate but would need to monitor his LFTs for worsening. This information has been relayed to the attending. Thank you for this consult. [facility] will not be following. Call back if needed.  ? ?Telephone consult only. No physical exam or interview performed. 25 minutes in time for consult.  ? ?Teddy Spike, DO ?  ?

## 2021-06-24 NOTE — Progress Notes (Addendum)
D. Pt has been calm and cooperative on the unit. Per pt's self inventory, pt rated his depression, hopelessness and anxiety a 4/6/3, respectively.Pt wrote that his goal was to "keep a positive mind, learn a lot and take my meds" , and wrote that he will achieve his goal by " attending group and listen to what the nurse says." Pt observed with interpreter attending groups. Pt currently denies SI/HI and AVH .  ?A. Labs and vitals monitored. Pt given and educated on medications. Pt supported emotionally and encouraged to express concerns and ask questions.   ?R. Pt remains safe with 15 minute checks. Will continue POC. ? ?  ?

## 2021-06-24 NOTE — [facility] Group Notes (Signed)
Adult Psychoeducational Group  ?Date:  06/24/2021 ?Time:  1300-1400 ? ?Group Topic/Focus: Continuation of the group from Saturday. Looking at the lists that were created and talking about what needs to be done with the homework of 30 positives about themselves.  ?                                   Talking about taking their power back and helping themselves to develop a positive self esteem. ?     ?Participation Quality:  Appropriate ? ?Affect:  Appropriate ? ?Cognitive:  Oriented ? ?Insight: Improving ? ?Engagement in Group:  Engaged ? ?Modes of Intervention:  Activity, Discussion, Education, and Support ? ?Additional Comments:  Rates his energy at a 10/10. Had his interperter and understood all that was said ? ?Vira Blanco A ? ? ?

## 2021-06-24 NOTE — Plan of Care (Addendum)
Patient noted to have bump in fasting lipid panel with cholesterol 196 ->334 and triglycerides 333->657. His AST increased to 57 from 42 and his ALT increased to 103 from 38. NP consulted hospitalist and I spoke with Dr. Marylyn Ishihara. He advised that this is most likely secondary to Zyprexa and he recommends trying to transition patient to a more metabolically neurtral medication if possible. He advised we can start Fenofibrate but will have to monitor LFTs on this med..  ? ?I spoke to the patient via [place] interpreter and explained that his triglycerides and cholesterol have increased as well as his LFTs.  I explained that this could be partly related to diet and heredity but is most likely related to start of Zyprexa given the rapid increase in these numbers since being in the hospital.  Advised that we have spoken with the internal medicine doctor who recommends we try to transition him to a different antipsychotic.  We discussed that the hope is that he will not need long-term antipsychotic use if his symptoms were related to illicit drug use and a substance-induced psychosis.  I advised that abrupt discontinuation of his current antipsychotic could lead to rebound psychosis.  I discussed decreasing his Zyprexa dose in half tonight and holding his morning dose until his primary team can assess him tomorrow.  I discussed that he could potentially start transitioning tomorrow onto a different atypical antipsychotic with potentially less risk of hyperlipidemia such as Risperdal or Abilify or Geodon. He will have PRN Risperdal po and Geodon IM available tonight for any break-through psychosis experienced with dose reduction. He will also have PRN Trazodone available for insomnia if needed. He currently denies AVH, paranoia or delusional thinking. He was advised that Fenofibrate has the risk of increasing his LFTs but he agrees to trial of medication. He was cautioned about medical risks associated with long-term  hyperlipidemia including risk of pancreatitis. He denies current GI sx or abdominal pain and was advised not notify the team if he develops any N/V/D or abdominal pain.  ? ?We will decrease Zyprexa to 7.5mg  qhs tonight and he has already received 2.5mg  today for total of 10mg  today. Will hold his morning Zyprexa dose and morning team can reassess and decide if he needs to start transitioning to different atypical antipsychotic. Will check repeat LFTs tomorrow for trending and add Fenofibrate 54mg  qd. The need for healthy diet and increased exercise were discussed and need for lipid and LFT monitoring by PCP after discharge were discussed. Time was given for questions. Will change PRN agitation protocol to po Risperdal and IM Geodon. Hepatitis acute panel nonreactive. ? ?Viann Fish, MD, FAPA ?

## 2021-06-24 NOTE — Progress Notes (Addendum)
[facility] MD Progress Note ? ?06/24/2021 1:30 PM ?Mayo Ao  ?MRN:  [id number] ? ?Subjective:  "I feel safe and more tranquil today." ? ?Brief History: Patient is a 26 year old male who denies having previously diagnosed psychiatric history, who was admitted to the psychiatric hospital for evaluation and treatment of suicidal thoughts with intent and plan to jump off bridge, paranoia, and auditory hallucinations.  He was admitted from [facility], which provided medical clearance. ? ?Daily Notes 06/24/21: During my assessment today, with interpreter, patient is sitting calmly in a chair. Chart reviewed and findings shared with the tx team and discussed with Dr. Mason Jim. Alert and oriented to person, time, place and situation. Speech is fluent, with normal pattern and volume in Spanish. Mood/Affect is appropriate, anxious and depressed. Thought process is coherent and linear and thought content is logical and WNL. Memory, judgement and insight fair. Actively attending therapeutic milieu and group activities. Taking prescribed medications without any adverse effects. ? ?Denied SI/HI/AVH and paranoia, added, "I am not hearing any voices anymore and feel safe because I do not feel anyone is following me. Endorsed sleeping for 9 hours last night, great appetite and taking medications as ordered. Reviewed routine labs include triglyceride 333, VLDL 67, and capillary blood glucose 107. Instruct patient to exercise regularly, and to aim for at least 30 minutes of physical activity on most or all days of the week. Avoid sugar and refined he drinks. AST 57, ALT 103, Cholesterol 334, and triglyceride 657.  Continues on CIWA protocol. Internal medicine consult called. Awaiting call back at this time. Fenofibrate 54 mg po ordered per internal Medicine and initiated . ? ?Principal Problem: Psychoactive substance-induced psychosis (HCC) ? ?Diagnosis: Principal Problem: ?  Psychoactive substance-induced psychosis  (HCC) ?Active Problems: ?  Adjustment disorder with mixed anxiety and depressed mood ?  Alcohol use disorder, severe, dependence (HCC) ?  Stimulant use disorder ? ?Total Time spent with patient: 30 minutes ? ?Past Psychiatric History: Denies history of psychiatric diagnosis in the past ?Denies prior psychiatric hospitalization ?Denies history of suicide attempt ?Denies current psychiatric medication prescription ?Denies history of psychiatric medications ?  ? ?Past Medical History: History reviewed. No pertinent past medical history. History reviewed. No pertinent surgical history. ? ?Family History: History reviewed. No pertinent family history. ? ?Family Psychiatric  History:  Denies family history of psychiatric illness.  Denies family history of suicide attempt.  ? ?Social History:  ?Social History  ? ?Substance and Sexual Activity  ?Alcohol Use Yes  ? Alcohol/week: 30.0 standard drinks  ? Types: 30 Cans of beer per week  ? Comment: 30 beers on the weekend  ?   ?Social History  ? ?Substance and Sexual Activity  ?Drug Use Yes  ? Types: Cocaine  ?  ?Social History  ? ?Socioeconomic History  ? Marital status: Single  ?  Spouse name: Not on file  ? Number of children: Not on file  ? Years of education: Not on file  ? Highest education level: Not on file  ?Occupational History  ? Not on file  ?Tobacco Use  ? Smoking status: Every Day  ?  Types: Cigarettes  ? Smokeless tobacco: Never  ?Vaping Use  ? Vaping Use: Never used  ?Substance and Sexual Activity  ? Alcohol use: Yes  ?  Alcohol/week: 30.0 standard drinks  ?  Types: 30 Cans of beer per week  ?  Comment: 30 beers on the weekend  ? Drug use: Yes  ?  Types: Cocaine  ?  Sexual activity: Not on file  ?Other Topics Concern  ? Not on file  ?Social History Narrative  ? Not on file  ? ?Social Determinants of Health  ? ?Financial Resource Strain: Not on file  ?Food Insecurity: Not on file  ?Transportation Needs: Not on file  ?Physical Activity: Not on file  ?Stress: Not on  file  ?Social Connections: Not on file  ? ?Additional Social History:  ?  ?Sleep: Good ? ?Appetite:  Good ? ?Current Medications: ?Current Facility-Administered Medications  ?Medication Dose Route Frequency Provider Last Rate Last Admin  ? acetaminophen (TYLENOL) tablet 650 mg  650 mg Oral Q6H PRN Dahlia Byesnuoha, Josephine C, NP   650 mg at 06/19/21 1048  ? alum & mag hydroxide-simeth (MAALOX/MYLANTA) 200-200-20 MG/5ML suspension 30 mL  30 mL Oral Q6H PRN Dahlia Byesnuoha, Josephine C, NP      ? escitalopram (LEXAPRO) tablet 10 mg  10 mg Oral Daily Massengill, Harrold DonathNathan, MD   10 mg at 06/24/21 0835  ? feeding supplement (ENSURE ENLIVE / ENSURE PLUS) liquid 237 mL  237 mL Oral BID BM Hill, Shelbie HutchingStephanie Leigh, MD   237 mL at 06/22/21 0933  ? folic acid (FOLVITE) tablet 1 mg  1 mg Oral Daily Onuoha, Josephine C, NP   1 mg at 06/24/21 [id number]  ? magnesium hydroxide (MILK OF MAGNESIA) suspension 30 mL  30 mL Oral Daily PRN Dahlia Byesnuoha, Josephine C, NP      ? multivitamin with minerals tablet 1 tablet  1 tablet Oral Daily Massengill, Nathan, MD   1 tablet at 06/24/21 [id number]  ? nicotine (NICODERM CQ - dosed in mg/24 hours) patch 14 mg  14 mg Transdermal Daily Roselle LocusHill, Stephanie Leigh, MD   14 mg at 06/24/21 [id number]  ? OLANZapine (ZYPREXA) injection 5 mg  5 mg Intramuscular TID PRN Massengill, Harrold DonathNathan, MD      ? OLANZapine (ZYPREXA) tablet 15 mg  15 mg Oral QHS Phineas InchesMassengill, Nathan, MD   15 mg at 06/23/21 2146  ? OLANZapine (ZYPREXA) tablet 2.5 mg  2.5 mg Oral Daily Massengill, Nathan, MD   2.5 mg at 06/24/21 0835  ? OLANZapine (ZYPREXA) tablet 5 mg  5 mg Oral TID PRN Massengill, Harrold DonathNathan, MD      ? thiamine tablet 100 mg  100 mg Oral Daily Onuoha, Josephine C, NP   100 mg at 06/24/21 0834  ? traZODone (DESYREL) tablet 50 mg  50 mg Oral QHS PRN Dahlia Byesnuoha, Josephine C, NP   50 mg at 06/22/21 2155  ? Vitamin D3 (Vitamin D) tablet 1,000 Units  1,000 Units Oral Daily Massengill, Harrold DonathNathan, MD   1,000 Units at 06/24/21 [id number]  ? ? ?Lab Results:  ?Results for orders placed or  performed during the hospital encounter of 06/18/21 (from the past 48 hour(s))  ?Glucose, capillary     Status: Abnormal  ? Collection Time: 06/22/21  7:50 PM  ?Result Value Ref Range  ? Glucose-Capillary 147 (H) 70 - 99 mg/dL  ?  Comment: Glucose reference range applies only to samples taken after fasting for at least 8 hours.  ?Lipid panel     Status: Abnormal  ? Collection Time: 06/24/21  6:26 AM  ?Result Value Ref Range  ? Cholesterol 334 (H) 0 - 200 mg/dL  ? Triglycerides 657 (H) <150 mg/dL  ? HDL 48 >40 mg/dL  ? Total CHOL/HDL Ratio 7.0 RATIO  ? VLDL UNABLE TO CALCULATE IF TRIGLYCERIDE OVER 400 mg/dL 0 - 40 mg/dL  ? LDL Cholesterol UNABLE TO CALCULATE IF  TRIGLYCERIDE OVER 400 mg/dL 0 - 99 mg/dL  ?  Comment:        ?Total Cholesterol/HDL:CHD Risk ?Coronary Heart Disease Risk Table ?                    Men   Women ? 1/2 Average Risk   3.4   3.3 ? Average Risk       5.0   4.4 ? 2 X Average Risk   9.6   7.1 ? 3 X Average Risk  23.4   11.0 ?       ?Use the calculated Patient Ratio ?above and the CHD Risk Table ?to determine the patient's CHD Risk. ?       ?ATP III CLASSIFICATION (LDL): ? <100     mg/dL   Optimal ? 071-219  mg/dL   Near or Above ?                   Optimal ? 130-159  mg/dL   Borderline ? 160-189  mg/dL   High ? >758     mg/dL   Very High ?Performed at [facility], [address] [place], [place] [address] ?  ?Comprehensive metabolic panel     Status: Abnormal  ? Collection Time: 06/24/21  6:26 AM  ?Result Value Ref Range  ? Sodium 141 135 - 145 mmol/L  ? Potassium 4.2 3.5 - 5.1 mmol/L  ? Chloride 106 98 - 111 mmol/L  ? CO2 26 22 - 32 mmol/L  ? Glucose, Bld 107 (H) 70 - 99 mg/dL  ?  Comment: Glucose reference range applies only to samples taken after fasting for at least 8 hours.  ? BUN 12 6 - 20 mg/dL  ? Creatinine, Ser 0.76 0.61 - 1.24 mg/dL  ? Calcium 9.8 8.9 - 10.3 mg/dL  ? Total Protein 7.4 6.5 - 8.1 g/dL  ? Albumin 4.1 3.5 - 5.0 g/dL  ? AST 57 (H) 15 - 41 U/L  ? ALT 103 (H)  0 - 44 U/L  ? Alkaline Phosphatase 80 38 - 126 U/L  ? Total Bilirubin 0.6 0.3 - 1.2 mg/dL  ? GFR, Estimated >60 >60 mL/min  ?  Comment: (NOTE) ?Calculated using the CKD-EPI Creatinine Equation (2021) ?  ? Ani

## 2021-06-24 NOTE — [facility] Group Notes (Signed)
Adult Psychoeducational Group Not ?Date:  06/24/2021 ?Time:  6384-6659 ?Group Topic/Focus: PROGRESSIVE RELAXATION. [name] group where deep breathing is taught and tensing and relaxation muscle groups is used. Imagery is used as well.  Pts are asked to imagine 3 pillars that hold them up when they are not able to hold themselves up and to share that with the group. ? ?Participation Level:  Active ? ?Participation Quality:  Appropriate ? ?Affect:  Appropriate ? ?Cognitive:  Oriented ? ?Insight: Improving ? ?Engagement in Group:  Engaged ? ?Modes of Intervention:  Activity, Discussion, Education, and Support ? ?Additional Comments:  Rates his energy level at [name] 8/10. States himself, the Bible and his relationship with God ? ?Raymond [name] ? ?

## 2021-06-25 ENCOUNTER — Encounter (HOSPITAL_COMMUNITY): Payer: Self-pay

## 2021-06-25 DIAGNOSIS — F39 Unspecified mood [affective] disorder: Secondary | ICD-10-CM | POA: Diagnosis not present

## 2021-06-25 LAB — HEPATIC FUNCTION PANEL
ALT: 96 U/L — ABNORMAL HIGH (ref 0–44)
AST: 50 U/L — ABNORMAL HIGH (ref 15–41)
Albumin: 3.7 g/dL (ref 3.5–5.0)
Alkaline Phosphatase: 78 U/L (ref 38–126)
Bilirubin, Direct: 0.1 mg/dL (ref 0.0–0.2)
Indirect Bilirubin: 0.2 mg/dL — ABNORMAL LOW (ref 0.3–0.9)
Total Bilirubin: 0.3 mg/dL (ref 0.3–1.2)
Total Protein: 6.8 g/dL (ref 6.5–8.1)

## 2021-06-25 NOTE — [facility] Group Notes (Signed)
Spiritual care group on grief and loss facilitated by chaplain Dyanne Carrel, [facility]  ? ?Group Goal:  ? ?Support / Education around grief and loss  ? ?Members engage in facilitated group support and psycho-social education.  ? ?Group Description:  ? ?Following introductions and group rules, group members engaged in facilitated group dialog and support around topic of loss, with particular support around experiences of loss in their lives. Group Identified types of loss (relationships / self / things) and identified patterns, circumstances, and changes that precipitate losses. Reflected on thoughts / feelings around loss, normalized grief responses, and recognized variety in grief experience. Group noted Worden's four tasks of grief in discussion.  ? ?Group drew on Adlerian / Rogerian, narrative, MI,  ? ?Patient Progress: Raymond [name] attended group and actively participated and engaged in group conversation with the help of an interpreter.  He shared about the loss of his father when he was 3 and the grief he feels over not having the chance to know him.  He received support from the group and was also supportive of others. ? ?[employer], [facility] ?Pager, [phone] ? ?

## 2021-06-25 NOTE — Group Note (Signed)
LCSW Group Therapy Note ? ? ?Group Date: 06/25/2021 ?Start Time: 1300 ?End Time: 1400 ? ?Type of Therapy and Topic:  Group Therapy: Thoughts, Feelings, and Actions ? ?Participation Level:  Active ? ? ?Description of Group:   ?In this group, each patient discussed their previous experiencing and understanding of overthinking, identifying the harmful impact on their lives. As a group, each patient was introduced to the basic concepts of Cognitive Behavioral Therapy: that thoughts, feelings, and actions are all connected and influence one another. They were given examples of how overthinking can affect our feelings, actions, and vise versa. The group was then asked to analyze how overthinking was harmful and brainstorm alternative thinking patterns/reactions to the example situation. Then, each group member filled out and identified their own example situation in which a problem situation caused their thoughts, feelings, and actions to be negatively impacted; they were asked to come up with 3 new (more adaptive/positive) thoughts that led to 3 new feelings and actions. ? ?Therapeutic Goals: ?Patients will review and discuss their past experience with overthinking. ?Patients will learn the basics of the CBT model through group-led examples.Marland Kitchen ?Patients will identify situations where they may have negative thoughts, feelings, or actions and will then reframe the situation using more positive thoughts to react differently. ? ?Summary of Patient Progress:  The patient shared that they feel safe today. Patient contributed to the discussion of how thoughts, feelings, and actions interact, noting when they may have experienced a negative thought pattern and recognized it as harmful. They were attentive when other patients shared their experiences, and worked to reframe their own thoughts in an activity to identify future situations where they may typically overthink. ? ?Therapeutic Modalities:   ?Cognitive Behavioral  Therapy ?Mindfulness ? ?Darleen Crocker, LCSWA ?06/25/2021  2:09 PM   ? ?

## 2021-06-25 NOTE — Progress Notes (Signed)
Pt reported that he was not anxious today, and stated that his depression level was 2 out of 10 with 10 being the highest amount.  Pt is calm and pleasant throughout shift  Pt's appetite is good and pt appears to be in no distress. Pt communicates via interpretor or computerized interpretor service.  Vitals signs stable. ?

## 2021-06-25 NOTE — Progress Notes (Signed)
?   06/24/21 2100  ?Psych Admission Type (Psych Patients Only)  ?Admission Status Voluntary  ?Psychosocial Assessment  ?Patient Complaints Anxiety  ?Eye Contact Fair  ?Facial Expression Flat;Sad  ?Affect Depressed  ?Speech Logical/coherent  ?Interaction Guarded;Minimal  ?Motor Activity Slow  ?Appearance/Hygiene In scrubs  ?Behavior Characteristics Cooperative  ?Mood Pleasant  ?Thought Process  ?Coherency Unable to assess  ?Content UTA  ?Delusions None reported or observed  ?Perception UTA  ?Hallucination None reported or observed  ?Judgment Poor  ?Confusion UTA  ?Danger to Self  ?Current suicidal ideation? Passive  ?Self-Injurious Behavior No self-injurious ideation or behavior indicators observed or expressed   ?Agreement Not to Harm Self Yes  ?Description of Agreement Verbal  ?Danger to Others  ?Danger to Others None reported or observed  ? ? ?

## 2021-06-25 NOTE — Group Note (Signed)
Recreation Therapy Group Note ? ? ?Group Topic:Stress Management  ?Group Date: 06/25/2021 ?Start Time: 0930 ?End Time: 1000 ?Facilitators: Victorino Sparrow, LRT,CTRS ?Location: [place] ? ? ?Goal Area(s) Addresses:  ?Patient will actively participate in stress management techniques presented during session.  ?Patient will successfully identify benefit of practicing stress management post d/c.  ? ?Group Description: Guided Imagery. LRT provided education, instruction, and demonstration on practice of visualization via guided imagery. Patient was asked to participate in the technique introduced during session. LRT debriefed including topics of mindfulness, stress management and specific scenarios each patient could use these techniques. Patients were given suggestions of ways to access scripts post d/c and encouraged to explore Youtube and other apps available on smartphones, tablets, and computers. ? ? ?Affect/Mood: N/A ?  ?Participation Level: N/A ?  ? ?Clinical Observations/Individualized Feedback:  Group did not occur due to orientation group starting extremely late, running over and using up group time.  ? ? ?Plan: Continue to engage patient in RT group sessions 2-3x/week. ? ? ?Victorino Sparrow, LRT,CTRS ?06/25/2021 2:26 PM ?

## 2021-06-25 NOTE — Progress Notes (Deleted)
[facility] MD Progress Note ? ?06/25/2021 10:34 AM ?Raymond [name]  ?MRN:  [id number] ?Reason for Admission:  Suicidal Ideation ?Principal Problem: Psychoactive substance-induced psychosis (HCC) ?Diagnosis: Principal Problem: ?  Psychoactive substance-induced psychosis (HCC) ?Active Problems: ?  Adjustment disorder with mixed anxiety and depressed mood ?  Alcohol use disorder, severe, dependence (HCC) ?  Stimulant use disorder ? ? ?Assessment and Plan ?Raymond [name] is a 26 y.o. male who presented to [facility] voluntarily from [facility] with SI, paranoia, and auditory hallucinations. Past Psychiatric History pertinent for substance induced psychotic disorder, adjustment disorder, alcohol use disorder, stimulant disorder, tobacco use disorder, . This is hospitalization day 7. ? ?Psychiatric Problems ?Substance induced psychotic Disorder ?Adjustment disorder with depressed and anxious features ?Alcohol use disorder ?Stimulant use disorder ?Rule out cannabis use disorder      ?CIWA 0>1. ?-Continue Lexapro 10 mg qd ?-Continue Zyprexa 7.5 qhs, discontinued AM dose of 2.5 mg due to elevated lipids ?-Continue Folvite, multivitamin, thiamine for vitamin supplementation ?-Agitation protocol: Risperdal M-tab 2 mg, Ativan 1 mg PO, geodon injection ?-Continues to deny need for substance rehab as he does not believe he has a substance use problem ? ?Tobacco use disorder ?-Continue nicotine patch qd ?-Recommend smoking cessation ? ?Medical Problems ?Hypertriglyceridemia ?Likely secondary to zyprexa, Direct LDL 151.7 ?-Continue fenofibrate ?-Continue to monitor outpatient ?-Will minimize zyprexa dosage as tolerated to reduce metabolic effects ?-Lifestyle modifications ? ?Transaminitis ?AST 57>50, ALT 103>96 ?-Repeat AM hepatic function panel ? ?Vitamin D deficiency ?-Continue vitamin D supplement ? ?PRNs ?-Tylenol 650 mg q6hr PRN for mild pain ?-Mylanta 30 ml suspension for indigestion ?-Milk of Magnesia 30 ml for constipation ?-Trazodone  50 mg for insomnia ?-Hydroxyzine 25 mg tid PRN for anxiety ? ? ?Dispo:Home tomorrow. Barriers include hypertriglyceridemia and transaminitis.  ? ?Subjective ?Patient seen and assessed in the day room with Dr. Sherron Flemings in Spanish interpreter.  Spanish interpreter was used throughout assessment today.  Patient reports feeling much better than before.  Patient denies auditory hallucinations and feels that he is more calm than before.  Patient reports having no suicidal ideation since last Friday.  Patient reports sleeping and eating appropriately.  Patient reports depressive symptoms are still present but better controlled. ? ?Discussed plan to continue Zyprexa at lower dose as to balance metabolic dysfunction and psychotic symptoms.  Also discussed need for psychiatrist and outpatient PCP follow-up in order to manage metabolic dysfunction and psychiatric medication management.  Patient was amenable to this.  Patient continues to deny need for substance use counseling or rehab as he does not suspect he has a substance use problem. ? ?Objective ?Chart Review ?Pas 24 hours of patient's chart was reviewed.  ?Patient is compliant with scheduled meds. ?Required Agitation PRNs: none ?Per RN notes, no documented behavioral issues and is  attending group. ?Patient slept, undocumented hours ? ?Lab Results:  ?Results for orders placed or performed during the hospital encounter of 06/18/21 (from the past 24 hour(s))  ?Hepatic function panel     Status: Abnormal  ? Collection Time: 06/25/21  6:23 AM  ?Result Value Ref Range  ? Total Protein 6.8 6.5 - 8.1 g/dL  ? Albumin 3.7 3.5 - 5.0 g/dL  ? AST 50 (H) 15 - 41 U/L  ? ALT 96 (H) 0 - 44 U/L  ? Alkaline Phosphatase 78 38 - 126 U/L  ? Total Bilirubin 0.3 0.3 - 1.2 mg/dL  ? Bilirubin, Direct 0.1 0.0 - 0.2 mg/dL  ? Indirect Bilirubin 0.2 (L) 0.3 - 0.9 mg/dL  ? ? ?  Physical Findings: ?AIMS: 0 ? ?Psychiatric Specialty Exam: ?Appearance: Appropriate, casual ?Behavior: Calm, good eye  contact, does not appear to be in acute distress ?Speech: Clear and coherent with normal rate rate ?Mood: "good" ?Affect: Still depressed, but more appropriate and congruent ?Thought Processes: Logical and linear ?Thought Content ?Hallucinations: Denies ?Delusions: Denies ?Suicidal Thoughts: Denies ?Homicidal Thoughts: Denies ? ?Cognition ?Alertness: Alert and oriented ?Attention: Good attention ?Capacity: Fair given willingness to take psychotropic medications ?Insight: Poor as continues to deny substance use and its effects on his mental status ? ?Psychomotor Activity:Psychomotor Activity: Normal ? ? ?Review of Systems  ?Respiratory:  Negative for shortness of breath.   ?Cardiovascular:  Negative for chest pain.  ?Gastrointestinal:  Negative for abdominal pain, constipation, diarrhea, heartburn, nausea and vomiting.  ?Neurological:  Negative for headaches.  ?Blood pressure (!) 121/92, pulse 91, temperature 97.7 ?F (36.5 ?C), resp. rate 17, height 5' 5.5" (1.664 m), weight 67.1 kg, SpO2 96 %. Body mass index is 24.25 kg/m?. ? ?Total Time spent with patient:  ?I personally spent 35 minutes on the unit in direct patient care. The direct patient care time included face-to-face time with the patient, reviewing the patient's chart, communicating with other professionals, and coordinating care. Greater than 50% of this time was spent in counseling or coordinating care with the patient regarding goals of hospitalization, psycho-education, and discharge planning needs.  ? ?Park Pope, MD ?PGY-1 Psychiatry Resident ?06/25/2021, 10:34 AM ?

## 2021-06-25 NOTE — Progress Notes (Addendum)
Raymond [name] ? ?06/25/2021 12:49 PM ?Raymond [name]  ?MRN:  [id number] ? ?Subjective:   ?Patient is a 26 year old male who denies having previously diagnosed psychiatric history, who was admitted to the psychiatric hospital for evaluation and treatment of suicidal thoughts with intent and plan to jump off bridge, paranoia, and auditory hallucinations.  He was admitted from [facility], which provided medical clearance. ? ?On evaluation today, the patient was interviewed with in person in [place] interpreter. ?Patient reports feeling much better than before.  Patient denies auditory hallucinations and feels that he feels that his mood and anxiety are "more calm than before".  He reports that mood is almost at euthymic baseline and much less depressed since admission.  Reports that sleep continues to be improved with Zyprexa.  Reports appetite continues to be "okay".  ?Patient reports having no suicidal ideation since last Friday.    ?Denies HI. ?Denies having side effects to current psychiatric medications.  We discussed again his elevated triglycerides, and patient is agreeable to continue fenofibrate and also having repeat lab work before discharge. ?Denies other somatic symptoms. ?Denies paranoia.  He is not in any kind of psychotic distress. ?  ?Discussed plan to continue Zyprexa at lower dose as to balance metabolic dysfunction and psychotic symptoms.  Also discussed need for psychiatrist and outpatient PCP follow-up in order to manage metabolic dysfunction and psychiatric medication management.  Patient was amenable to this.  Patient continues to deny need for substance use counseling or rehab as he does not suspect he has a substance use problem. ? ?Objective ?Chart Review ?Pas 24 hours of patient's chart was reviewed.  ?Patient is compliant with scheduled meds. ?Required Agitation PRNs: none ?Per RN notes, no documented behavioral issues and is  attending group. ?Patient slept,  undocumented hours ? ?Principal Problem: Psychoactive substance-induced psychosis ([place]) ?Diagnosis: Principal Problem: ?  Psychoactive substance-induced psychosis ([place]) ?Active Problems: ?  Adjustment disorder with mixed anxiety and depressed mood ?  Alcohol use disorder, severe, dependence ([place]) ?  Stimulant use disorder ? ? ?Total Time spent with patient: 30 minutes ? ?Past Psychiatric History:  ?Denies history of psychiatric diagnosis in the past ?Denies prior psychiatric hospitalization ?Denies history of suicide attempt ?Denies current psychiatric medication prescription ?Denies history of psychiatric medications ? ?Past Medical History:  ?History reviewed. No pertinent past medical history.  ?History reviewed. No pertinent surgical history. ?Family History: History reviewed. No pertinent family history. ? ?Family Psychiatric  History:  ?Denies family history of psychiatric illness.  Denies family history of suicide attempt. ? ?Social History:  ?Social History  ? ?Substance and Sexual Activity  ?Alcohol Use Yes  ? Alcohol/week: 30.0 standard drinks  ? Types: 30 Cans of beer per week  ? Comment: 30 beers on the weekend  ?   ?Social History  ? ?Substance and Sexual Activity  ?Drug Use Yes  ? Types: Cocaine  ?  ?Social History  ? ?Socioeconomic History  ? Marital status: Single  ?  Spouse name: Not on file  ? Number of children: Not on file  ? Years of education: Not on file  ? Highest education level: Not on file  ?Occupational History  ? Not on file  ?Tobacco Use  ? Smoking status: Every Day  ?  Types: Cigarettes  ? Smokeless tobacco: Never  ?Vaping Use  ? Vaping Use: Never used  ?Substance and Sexual Activity  ? Alcohol use: Yes  ?  Alcohol/week: 30.0 standard drinks  ?  Types: 30 Cans of beer per week  ?  Comment: 30 beers on the weekend  ? Drug use: Yes  ?  Types: Cocaine  ? Sexual activity: Not on file  ?Other Topics Concern  ? Not on file  ?Social History Narrative  ? Not on file  ? ?Social Determinants of  Health  ? ?Financial Resource Strain: Not on file  ?Food Insecurity: Not on file  ?Transportation Needs: Not on file  ?Physical Activity: Not on file  ?Stress: Not on file  ?Social Connections: Not on file  ? ?Additional Social History:  ?  ?  ?  ?  ?  ?  ?  ?  ?  ?  ?  ? ?Sleep: Good ? ?Appetite:  Good ? ?Current Medications: ?Current Facility-Administered Medications  ?Medication Dose Route Frequency Provider Last Rate Last Admin  ? acetaminophen (TYLENOL) tablet 650 mg  650 mg Oral Q6H PRN Charmaine Downs C, NP   650 mg at 06/19/21 1048  ? alum & mag hydroxide-simeth (MAALOX/MYLANTA) 200-200-20 MG/5ML suspension 30 mL  30 mL Oral Q6H PRN Charmaine Downs C, NP      ? escitalopram (LEXAPRO) tablet 10 mg  10 mg Oral Daily Sydni Elizarraraz, Ovid Curd, MD   10 mg at 06/25/21 0950  ? feeding supplement (ENSURE ENLIVE / ENSURE PLUS) liquid 237 mL  237 mL Oral BID BM Hill, Jackie Plum, MD   237 mL at 06/25/21 0953  ? fenofibrate tablet 54 mg  54 mg Oral Daily Ntuen, Kris Hartmann, FNP   54 mg at 06/25/21 0949  ? folic acid (FOLVITE) tablet 1 mg  1 mg Oral Daily Onuoha, Josephine C, NP   1 mg at 06/25/21 0950  ? risperiDONE (RISPERDAL M-TABS) disintegrating tablet 2 mg  2 mg Oral Q8H PRN Harlow Asa, MD      ? And  ? LORazepam (ATIVAN) tablet 1 mg  1 mg Oral PRN Harlow Asa, MD      ? And  ? ziprasidone (GEODON) injection 20 mg  20 mg Intramuscular PRN Nelda Marseille, Amy E, MD      ? magnesium hydroxide (MILK OF MAGNESIA) suspension 30 mL  30 mL Oral Daily PRN Charmaine Downs C, NP      ? multivitamin with minerals tablet 1 tablet  1 tablet Oral Daily Jaycey Gens, MD   1 tablet at 06/25/21 0949  ? nicotine (NICODERM CQ - dosed in mg/24 hours) patch 14 mg  14 mg Transdermal Daily Maida Sale, MD   14 mg at 06/25/21 0950  ? OLANZapine (ZYPREXA) tablet 7.5 mg  7.5 mg Oral QHS Nelda Marseille, Amy E, MD   7.5 mg at 06/24/21 2126  ? thiamine tablet 100 mg  100 mg Oral Daily Onuoha, Josephine C, NP   100 mg at  06/25/21 0950  ? traZODone (DESYREL) tablet 50 mg  50 mg Oral QHS PRN Charmaine Downs C, NP   50 mg at 06/22/21 2155  ? Vitamin D3 (Vitamin D) tablet 1,000 Units  1,000 Units Oral Daily Zykia Walla, Ovid Curd, MD   1,000 Units at 06/25/21 [id number]  ? ? ?Lab Results:  ?Results for orders placed or performed during the hospital encounter of 06/18/21 (from the past 48 hour(s))  ?Lipid panel     Status: Abnormal  ? Collection Time: 06/24/21  6:26 AM  ?Result Value Ref Range  ? Cholesterol 334 (H) 0 - 200 mg/dL  ? Triglycerides 657 (H) <150 mg/dL  ? HDL 48 >40 mg/dL  ?  Total CHOL/HDL Ratio 7.0 RATIO  ? VLDL UNABLE TO CALCULATE IF TRIGLYCERIDE OVER 400 mg/dL 0 - 40 mg/dL  ? LDL Cholesterol UNABLE TO CALCULATE IF TRIGLYCERIDE OVER 400 mg/dL 0 - 99 mg/dL  ?  Comment:        ?Total Cholesterol/HDL:CHD Risk ?Coronary Heart Disease Risk Table ?                    Men   Women ? 1/2 Average Risk   3.4   3.3 ? Average Risk       5.0   4.4 ? 2 X Average Risk   9.6   7.1 ? 3 X Average Risk  23.4   11.0 ?       ?Use the calculated Patient Ratio ?above and the CHD Risk Table ?to determine the patient's CHD Risk. ?       ?ATP III CLASSIFICATION (LDL): ? <100     mg/dL   Optimal ? 100-129  mg/dL   Near or Above ?                   Optimal ? 130-159  mg/dL   Borderline ? 160-189  mg/dL   High ? >190     mg/dL   Very High ?Performed at [facility], [place] [address], [place], [place] [address] ?  ?Comprehensive metabolic panel     Status: Abnormal  ? Collection Time: 06/24/21  6:26 AM  ?Result Value Ref Range  ? Sodium 141 135 - 145 mmol/L  ? Potassium 4.2 3.5 - 5.1 mmol/L  ? Chloride 106 98 - 111 mmol/L  ? CO2 26 22 - 32 mmol/L  ? Glucose, Bld 107 (H) 70 - 99 mg/dL  ?  Comment: Glucose reference range applies only to samples taken after fasting for at least 8 hours.  ? BUN 12 6 - 20 mg/dL  ? Creatinine, Ser 0.76 0.61 - 1.24 mg/dL  ? Calcium 9.8 8.9 - 10.3 mg/dL  ? Total Protein 7.4 6.5 - 8.1 g/dL  ? Albumin 4.1 3.5 - 5.0 g/dL   ? AST 57 (H) 15 - 41 U/L  ? ALT 103 (H) 0 - 44 U/L  ? Alkaline Phosphatase 80 38 - 126 U/L  ? Total Bilirubin 0.6 0.3 - 1.2 mg/dL  ? GFR, Estimated >60 >60 mL/min  ?  Comment: ([name]) ?Calculated using the CKD-EPI Cr

## 2021-06-25 NOTE — BH IP Treatment Plan (Signed)
Interdisciplinary Treatment and Diagnostic Plan Update ? ?06/25/2021 ?Time of Session: 9:40am ?Raymond [name] ?MRN: [id number] ? ?Principal Diagnosis: Psychoactive substance-induced psychosis (HCC) ? ?Secondary Diagnoses: Principal Problem: ?  Psychoactive substance-induced psychosis (HCC) ?Active Problems: ?  Adjustment disorder with mixed anxiety and depressed mood ?  Alcohol use disorder, severe, dependence (HCC) ?  Stimulant use disorder ? ? ?Current Medications:  ?Current Facility-Administered Medications  ?Medication Dose Route Frequency Provider Last Rate Last Admin  ? acetaminophen (TYLENOL) tablet 650 mg  650 mg Oral Q6H PRN Dahlia Byesnuoha, Josephine C, NP   650 mg at 06/19/21 1048  ? alum & mag hydroxide-simeth (MAALOX/MYLANTA) 200-200-20 MG/5ML suspension 30 mL  30 mL Oral Q6H PRN Dahlia Byesnuoha, Josephine C, NP      ? escitalopram (LEXAPRO) tablet 10 mg  10 mg Oral Daily Massengill, Harrold DonathNathan, MD   10 mg at 06/25/21 0950  ? feeding supplement (ENSURE ENLIVE / ENSURE PLUS) liquid 237 mL  237 mL Oral BID BM Hill, Shelbie HutchingStephanie Leigh, MD   237 mL at 06/25/21 0953  ? fenofibrate tablet 54 mg  54 mg Oral Daily Ntuen, Jesusita Okaina C, FNP   54 mg at 06/25/21 0949  ? folic acid (FOLVITE) tablet 1 mg  1 mg Oral Daily Onuoha, Josephine C, NP   1 mg at 06/25/21 0950  ? risperiDONE (RISPERDAL M-TABS) disintegrating tablet 2 mg  2 mg Oral Q8H PRN Comer LocketSingleton, Amy E, MD      ? And  ? LORazepam (ATIVAN) tablet 1 mg  1 mg Oral PRN Comer LocketSingleton, Amy E, MD      ? And  ? ziprasidone (GEODON) injection 20 mg  20 mg Intramuscular PRN Mason JimSingleton, Amy E, MD      ? magnesium hydroxide (MILK OF MAGNESIA) suspension 30 mL  30 mL Oral Daily PRN Dahlia Byesnuoha, Josephine C, NP      ? multivitamin with minerals tablet 1 tablet  1 tablet Oral Daily Massengill, Nathan, MD   1 tablet at 06/25/21 0949  ? nicotine (NICODERM CQ - dosed in mg/24 hours) patch 14 mg  14 mg Transdermal Daily Roselle LocusHill, Stephanie Leigh, MD   14 mg at 06/25/21 0950  ? OLANZapine (ZYPREXA) tablet 7.5 mg  7.5  mg Oral QHS Mason JimSingleton, Amy E, MD   7.5 mg at 06/24/21 2126  ? thiamine tablet 100 mg  100 mg Oral Daily Onuoha, Josephine C, NP   100 mg at 06/25/21 0950  ? traZODone (DESYREL) tablet 50 mg  50 mg Oral QHS PRN Dahlia Byesnuoha, Josephine C, NP   50 mg at 06/22/21 2155  ? Vitamin D3 (Vitamin D) tablet 1,000 Units  1,000 Units Oral Daily Phineas InchesMassengill, Nathan, MD   1,000 Units at 06/25/21 0949  ? ?PTA Medications: ?No medications prior to admission.  ? ? ?Patient Stressors: Substance abuse   ?Other: Paranoia   ? ?Patient Strengths: Work Programmer, applicationsskills  ? ?Treatment Modalities: Medication Management, Group therapy, Case management,  ?1 to 1 session with clinician, Psychoeducation, Recreational therapy. ? ? ?Physician Treatment Plan for Primary Diagnosis: Psychoactive substance-induced psychosis (HCC) ?Long Term Goal(s): Improvement in symptoms so as ready for discharge  ? ?Short Term Goals: Ability to identify changes in lifestyle to reduce recurrence of condition will improve ?Ability to verbalize feelings will improve ?Ability to disclose and discuss suicidal ideas ?Ability to demonstrate self-control will improve ?Ability to identify and develop effective coping behaviors will improve ?Ability to maintain clinical measurements within normal limits will improve ?Compliance with prescribed medications will improve ?Ability to identify  triggers associated with substance abuse/mental health issues will improve ? ?Medication Management: Evaluate patient's response, side effects, and tolerance of medication regimen. ? ?Therapeutic Interventions: 1 to 1 sessions, Unit Group sessions and Medication administration. ? ?Evaluation of Outcomes: Progressing ? ?Physician Treatment Plan for Secondary Diagnosis: Principal Problem: ?  Psychoactive substance-induced psychosis (HCC) ?Active Problems: ?  Adjustment disorder with mixed anxiety and depressed mood ?  Alcohol use disorder, severe, dependence (HCC) ?  Stimulant use disorder ? ?Long Term  Goal(s): Improvement in symptoms so as ready for discharge  ? ?Short Term Goals: Ability to identify changes in lifestyle to reduce recurrence of condition will improve ?Ability to verbalize feelings will improve ?Ability to disclose and discuss suicidal ideas ?Ability to demonstrate self-control will improve ?Ability to identify and develop effective coping behaviors will improve ?Ability to maintain clinical measurements within normal limits will improve ?Compliance with prescribed medications will improve ?Ability to identify triggers associated with substance abuse/mental health issues will improve    ? ?Medication Management: Evaluate patient's response, side effects, and tolerance of medication regimen. ? ?Therapeutic Interventions: 1 to 1 sessions, Unit Group sessions and Medication administration. ? ?Evaluation of Outcomes: Progressing ? ? ?RN Treatment Plan for Primary Diagnosis: Psychoactive substance-induced psychosis (HCC) ?Long Term Goal(s): Knowledge of disease and therapeutic regimen to maintain health will improve ? ?Short Term Goals: Ability to remain free from injury will improve, Ability to verbalize frustration and anger appropriately will improve, Ability to demonstrate self-control, Ability to disclose and discuss suicidal ideas, Ability to identify and develop effective coping behaviors will improve, and Compliance with prescribed medications will improve ? ?Medication Management: RN will administer medications as ordered by provider, will assess and evaluate patient's response and provide education to patient for prescribed medication. RN will report any adverse and/or side effects to prescribing provider. ? ?Therapeutic Interventions: 1 on 1 counseling sessions, Psychoeducation, Medication administration, Evaluate responses to treatment, Monitor vital signs and CBGs as ordered, Perform/monitor CIWA, COWS, AIMS and Fall Risk screenings as ordered, Perform wound care treatments as  ordered. ? ?Evaluation of Outcomes: Progressing ? ? ?LCSW Treatment Plan for Primary Diagnosis: Psychoactive substance-induced psychosis (HCC) ?Long Term Goal(s): Safe transition to appropriate next level of care at discharge, Engage patient in therapeutic group addressing interpersonal concerns. ? ?Short Term Goals: Engage patient in aftercare planning with referrals and resources, Increase social support, Increase ability to appropriately verbalize feelings, Increase emotional regulation, Identify triggers associated with mental health/substance abuse issues, and Increase skills for wellness and recovery ? ?Therapeutic Interventions: Assess for all discharge needs, 1 to 1 time with Child psychotherapist, Explore available resources and support systems, Assess for adequacy in community support network, Educate family and significant other(s) on suicide prevention, Complete Psychosocial Assessment, Interpersonal group therapy. ? ?Evaluation of Outcomes: Progressing ? ? ?Progress in Treatment: ?Attending groups: Yes. ?Participating in groups: Yes. ?Taking medication as prescribed: Yes. ?Toleration medication: Yes. ?Family/Significant other contact made: No, will contact:  friend ?Patient understands diagnosis: Yes. ?Discussing patient identified problems/goals with staff: Yes. ?Medical problems stabilized or resolved: Yes. ?Denies suicidal/homicidal ideation: Yes. ?Issues/concerns per patient self-inventory: No. ? ? ?New problem(s) identified: No, Describe:  none ? ?New Short Term/Long Term Goal(s): detox, medication management for mood stabilization; elimination of SI thoughts; development of comprehensive mental wellness/sobriety plan ? ?Patient Goals:  Did not attend ? ?Discharge Plan or Barriers: Patient is to follow up at [facility] [place] for counseling and medication management ? ?Reason for Continuation of Hospitalization: Depression ?Medication stabilization ?  Suicidal ideation ? ?Estimated Length of  Stay: 1-3 days ? ?Last 3 [place] Suicide Severity Risk Score: ?Flowsheet Row [facility] (Current[facility]

## 2021-06-25 NOTE — Group Note (Signed)
Occupational Therapy Group Note ? ?Group Topic:Other  ?Group Date: 06/25/2021 ?Start Time: 1400 ?End Time: 1500 ?Facilitators: Ted Mcalpine, OT  ? ? ?Routines are sets of habitual activities or practices that we engage in on a regular basis. They can be as simple as a morning cup of coffee or as complex as a daily workout routine. In this essay, we will explore the power of routines in promoting mental health and wellbeing, the reasons why they are important, and how to identify areas of our lives where routines can help improve our overall mental health. Wellbeing The power of routines lies in their ability to provide structure, stability, and predictability in our lives. They help [place] to establish healthy habits and reduce stress and anxiety by minimizing decision fatigue. They also help [place] to manage our time effectively and achieve our goals, which can boost our sense of self-efficacy and confidence. Routines can also foster a sense of community and social connectedness by providing opportunities to engage in shared activities with others. , routines can be a powerful tool in promoting mental health and wellbeing. By providing structure, stability, and predictability in our lives, routines can help [place] to establish healthy habits, reduce stress and anxiety, manage our time effectively, achieve our goals, and foster a sense of community and social connectedness. By identifying areas of our lives where routines can improve our mental health and wellbeing, and by following tips for establishing and maintaining healthy routines, we can harness the power of routines to improve our overall quality of life. ? ? ? ? ?Participation Level: Minimal ?  ?Participation Quality: W/ interpreter present ?  ?Behavior: Alert, Appropriate, and Attentive  ?  ?Speech/Thought Process: Barely audible ?  ?Affect/Mood: Appropriate ?  ?Insight: Age-appropriate ?  ?Judgement: Age-appropriate ?  ?Individualization: Pt was active but passive  w/ assist of spanish interpreter in their participation of group discussion/activity. New routines were identified  ?Modes of Intervention: Discussion and Education  ?Patient Response to Interventions:  Receptive ?  ?Plan: Continue to engage patient in OT groups 2 - 3x/week. ? ?06/25/2021  ?Ted Mcalpine, OT ? ?Kerrin Champagne, OT ? ? ? ? ?

## 2021-06-26 DIAGNOSIS — F39 Unspecified mood [affective] disorder: Secondary | ICD-10-CM | POA: Diagnosis not present

## 2021-06-26 LAB — LIPID PANEL
Cholesterol: 365 mg/dL — ABNORMAL HIGH (ref 0–200)
HDL: 47 mg/dL (ref >40)
LDL Cholesterol: UNDETERMINED mg/dL (ref 0–99)
Total CHOL/HDL Ratio: 7.8 RATIO
Triglycerides: 852 mg/dL — ABNORMAL HIGH (ref <150)
VLDL: UNDETERMINED mg/dL (ref 0–40)

## 2021-06-26 LAB — HEPATIC FUNCTION PANEL
ALT: 117 U/L — ABNORMAL HIGH (ref 0–44)
AST: 64 U/L — ABNORMAL HIGH (ref 15–41)
Albumin: 4.1 g/dL (ref 3.5–5.0)
Alkaline Phosphatase: 81 U/L (ref 38–126)
Bilirubin, Direct: 0.1 mg/dL (ref 0.0–0.2)
Indirect Bilirubin: 0.3 mg/dL (ref 0.3–0.9)
Total Bilirubin: 0.4 mg/dL (ref 0.3–1.2)
Total Protein: 7.1 g/dL (ref 6.5–8.1)

## 2021-06-26 LAB — LDL CHOLESTEROL, DIRECT: Direct LDL: 164.3 mg/dL — ABNORMAL HIGH (ref 0–99)

## 2021-06-26 MED ORDER — ARIPIPRAZOLE 10 MG PO TABS
10.0000 mg | ORAL_TABLET | Freq: Every day | ORAL | Status: DC
Start: 2021-06-26 — End: 2021-06-28
  Administered 2021-06-26 – 2021-06-27 (×2): 10 mg via ORAL
  Filled 2021-06-26 (×3): qty 1

## 2021-06-26 MED ORDER — IBUPROFEN 400 MG PO TABS
400.0000 mg | ORAL_TABLET | Freq: Four times a day (QID) | ORAL | Status: DC | PRN
  Administered 2021-06-27: 400 mg via ORAL
  Filled 2021-06-26: qty 1

## 2021-06-26 NOTE — BHH Counselor (Signed)
CSW met with patient to finalize discharge plan. Patient reports that he was able to get in contact with a work friend who visited him the previous night.  He is aware that doctors are thinking about discharge.  He believes that a friend will be able to pick him up at the hospital but noted that they work and may not be able to pick up until after 5pm.   ? ? ?Annete Ayuso, LCSW, LCAS ?Clincal Social Worker  ?[facility] ? ?

## 2021-06-26 NOTE — Progress Notes (Signed)
?   06/26/21 0000  ?Psych Admission Type (Psych Patients Only)  ?Admission Status Voluntary  ?Psychosocial Assessment  ?Patient Complaints Anxiety  ?Eye Contact Fair  ?Facial Expression Anxious  ?Affect Anxious;Depressed  ?Speech Logical/coherent  ?Interaction Guarded  ?Motor Activity Slow  ?Appearance/Hygiene In scrubs  ?Behavior Characteristics Cooperative  ?Mood Pleasant  ?Aggressive Behavior  ?Effect No apparent injury  ?Thought Process  ?Coherency WDL  ?Content WDL  ?Delusions None reported or observed  ?Perception WDL  ?Hallucination None reported or observed  ?Judgment Poor  ?Confusion WDL  ?Danger to Self  ?Current suicidal ideation? Denies  ?Danger to Others  ?Danger to Others None reported or observed  ? ? ?

## 2021-06-26 NOTE — Progress Notes (Signed)
Pt presents with depressed mood, affect congruent. Raymond [name] was approached with Spanish speaking interpreter. When asked how he would describe his mood he states '' desperate'' and appears sad. He states '' i'm calm but I'm also desperate, just this whole situation. I'm also worried about my cholesterol. I don't understand it. '' Patient denies any SI or HI denies having any continued AH or VH. Patient states voices have stopped. Discussed dietary and lifestyle changes at length with the patient to help aid in reducing cholesterol, including exercise, limiting red meats, increasing dietary fiber and reducing processed sugar and processed/fried foods. Patient compliant with medications and also visible on the unit attending programming with the aid of interpreter. Pt is safe, will con't to monitor.  ?

## 2021-06-26 NOTE — [facility] Group Notes (Signed)
Adult Psychoeducational Group Note ? ?Date:  06/26/2021 ?Time:  10:13 AM ? ?Group Topic/Focus:  ?Orientation:   The focus of this group is to educate the patient on the purpose and policies of crisis stabilization and provide a format to answer questions about their admission.  The group details unit policies and expectations of patients while admitted. ? ?Participation Level:  Active ? ?Participation Quality:  Appropriate ? ?Affect:  Appropriate ? ?Cognitive:  Appropriate ? ?Insight: Appropriate ? ?Engagement in Group:  Engaged ? ?Modes of Intervention:  Education ? ?Additional Comments:  Pt participated in group.  ? ?Thomas Hoff ?06/26/2021, 10:13 AM ?

## 2021-06-26 NOTE — Progress Notes (Addendum)
[facility] MD Progress Note ? ?06/26/2021 11:01 AM ?Raymond [name]  ?MRN:  [id number] ? ?Reason for Admission: Patient is a 26 year old male who denies having previously diagnosed psychiatric history, who was admitted to the psychiatric hospital for evaluation and treatment of suicidal thoughts with intent and plan to jump off bridge, paranoia, and auditory hallucinations.  He was admitted from [facility], which provided medical clearance. ? ?Psych Recommendations from yesterday: ?-Continue Lexapro 10 mg qd ?-Continue Zyprexa 7.5 qhs ? ?Subjective ?On evaluation today, the patient was interviewed with in person in [place] interpreter. ?Patient reports his mind continues to be "calm" and appears to be approaching baseline euthymia. ?Patient reports he is not experiencing any AVH/SI/HI. ?Patient reports appetite is appropriate as well as sleep is appropriate. ?Patient reports that he occasionally is experiencing some nightmares but is uncertain if these are related to medication or just random occurrences. ?Denies having side effects to current psychiatric medications.  We discussed again his elevated triglycerides, and patient is agreeable to continue fenofibrate and discussed changing Zyprexa to Abilify and getting better present metabolic side effects.  Denies other somatic symptoms. ?Denies paranoia.  He is not in any kind of psychotic distress. ?  ?Discussed that he would require further monitoring on observation of lipids as well as psychosis with the transition from Zyprexa to Abilify.  Patient was amenable to this.  Also reiterated need for psychiatrist and outpatient PCP follow-up in order to manage metabolic dysfunction and psychiatric medication management.  ? ?Objective ?Chart Review ?Pas 24 hours of patient's chart was reviewed.  ?Patient is compliant with scheduled meds. ?Required Agitation PRNs: none ?Per RN notes, no documented behavioral issues and is  attending group. ?Patient slept, 8  hours ? ?Principal Problem: Psychoactive substance-induced psychosis ([place]) ?Diagnosis: Principal Problem: ?  Psychoactive substance-induced psychosis ([place]) ?Active Problems: ?  Adjustment disorder with mixed anxiety and depressed mood ?  Alcohol use disorder, severe, dependence ([place]) ?  Stimulant use disorder ? ? ?Total Time spent with patient: 30 minutes ? ?Past Psychiatric History:  ?Denies history of psychiatric diagnosis in the past ?Denies prior psychiatric hospitalization ?Denies history of suicide attempt ?Denies current psychiatric medication prescription ?Denies history of psychiatric medications ? ?Past Medical History:  ?History reviewed. No pertinent past medical history.  ?History reviewed. No pertinent surgical history. ?Family History: History reviewed. No pertinent family history. ? ?Family Psychiatric  History:  ?Denies family history of psychiatric illness.  Denies family history of suicide attempt. ? ?Social History:  ?Social History  ? ?Substance and Sexual Activity  ?Alcohol Use Yes  ? Alcohol/week: 30.0 standard drinks  ? Types: 30 Cans of beer per week  ? Comment: 30 beers on the weekend  ?   ?Social History  ? ?Substance and Sexual Activity  ?Drug Use Yes  ? Types: Cocaine  ?  ?Social History  ? ?Socioeconomic History  ? Marital status: Single  ?  Spouse name: Not on file  ? Number of children: Not on file  ? Years of education: Not on file  ? Highest education level: Not on file  ?Occupational History  ? Not on file  ?Tobacco Use  ? Smoking status: Every Day  ?  Types: Cigarettes  ? Smokeless tobacco: Never  ?Vaping Use  ? Vaping Use: Never used  ?Substance and Sexual Activity  ? Alcohol use: Yes  ?  Alcohol/week: 30.0 standard drinks  ?  Types: 30 Cans of beer per week  ?  Comment: 30 beers on  the weekend  ? Drug use: Yes  ?  Types: Cocaine  ? Sexual activity: Not on file  ?Other Topics Concern  ? Not on file  ?Social History Narrative  ? Not on file  ? ?Social Determinants of Health   ? ?Financial Resource Strain: Not on file  ?Food Insecurity: Not on file  ?Transportation Needs: Not on file  ?Physical Activity: Not on file  ?Stress: Not on file  ?Social Connections: Not on file  ? ?Additional Social History:  ?  ?  ?  ?  ?  ?  ?  ?  ?  ?  ?  ? ?Sleep: Good ? ?Appetite:  Good ? ?Current Medications: ?Current Facility-Administered Medications  ?Medication Dose Route Frequency Provider Last Rate Last Admin  ? alum & mag hydroxide-simeth (MAALOX/MYLANTA) [id number] MG/5ML suspension 30 mL  30 mL Oral Q6H PRN Delfin Gant, NP      ? ARIPiprazole (ABILIFY) tablet 10 mg  10 mg Oral Aliene Altes, MD      ? escitalopram Loma Sousa) tablet 10 mg  10 mg Oral Daily Massengill, Ovid Curd, MD   10 mg at 06/26/21 [id number]  ? feeding supplement (ENSURE ENLIVE / ENSURE PLUS) liquid 237 mL  237 mL Oral BID BM Hill, Jackie Plum, MD   237 mL at 06/25/21 1636  ? fenofibrate tablet 54 mg  54 mg Oral Daily Laretta Bolster, FNP   54 mg at 06/26/21 [id number]  ? folic acid (FOLVITE) tablet 1 mg  1 mg Oral Daily Charmaine Downs C, NP   1 mg at 06/26/21 [id number]  ? ibuprofen (ADVIL) tablet 400 mg  400 mg Oral Q6H PRN Massengill, Ovid Curd, MD      ? risperiDONE (RISPERDAL M-TABS) disintegrating tablet 2 mg  2 mg Oral Q8H PRN Harlow Asa, MD      ? And  ? LORazepam (ATIVAN) tablet 1 mg  1 mg Oral PRN Harlow Asa, MD      ? And  ? ziprasidone (GEODON) injection 20 mg  20 mg Intramuscular PRN Harlow Asa, MD      ? magnesium hydroxide (MILK OF MAGNESIA) suspension 30 mL  30 mL Oral Daily PRN Charmaine Downs C, NP      ? multivitamin with minerals tablet 1 tablet  1 tablet Oral Daily Massengill, Nathan, MD   1 tablet at 06/26/21 [id number]  ? nicotine (NICODERM CQ - dosed in mg/24 hours) patch 14 mg  14 mg Transdermal Daily Maida Sale, MD   14 mg at 06/26/21 0820  ? thiamine tablet 100 mg  100 mg Oral Daily Charmaine Downs C, NP   100 mg at 06/26/21 [id number]  ? traZODone (DESYREL) tablet 50 mg  50 mg Oral QHS PRN  Charmaine Downs C, NP   50 mg at 06/25/21 2213  ? Vitamin D3 (Vitamin D) tablet 1,000 Units  1,000 Units Oral Daily Janine Limbo, MD   1,000 Units at 06/26/21 [id number]  ? ? ?Lab Results:  ?Results for orders placed or performed during the hospital encounter of 06/18/21 (from the past 48 hour(s))  ?Hepatic function panel     Status: Abnormal  ? Collection Time: 06/25/21  6:23 AM  ?Result Value Ref Range  ? Total Protein 6.8 6.5 - 8.1 g/dL  ? Albumin 3.7 3.5 - 5.0 g/dL  ? AST 50 (H) 15 - 41 U/L  ? ALT 96 (H) 0 - 44 U/L  ? Alkaline Phosphatase 78 38 - 126 U/L  ?  Total Bilirubin 0.3 0.3 - 1.2 mg/dL  ? Bilirubin, Direct 0.1 0.0 - 0.2 mg/dL  ? Indirect Bilirubin 0.2 (L) 0.3 - 0.9 mg/dL  ?  Comment: Performed at [facility], [place] [address], [place], [place] [address]  ?Lipid panel     Status: Abnormal  ? Collection Time: 06/26/21  6:51 AM  ?Result Value Ref Range  ? Cholesterol 365 (H) 0 - 200 mg/dL  ? Triglycerides 852 (H) <150 mg/dL  ? HDL 47 >40 mg/dL  ? Total CHOL/HDL Ratio 7.8 RATIO  ? VLDL UNABLE TO CALCULATE IF TRIGLYCERIDE OVER 400 mg/dL 0 - 40 mg/dL  ? LDL Cholesterol UNABLE TO CALCULATE IF TRIGLYCERIDE OVER 400 mg/dL 0 - 99 mg/dL  ?  Comment:        ?Total Cholesterol/HDL:CHD Risk ?Coronary Heart Disease Risk Table ?                    Men   Women ? 1/2 Average Risk   3.4   3.3 ? Average Risk       5.0   4.4 ? 2 X Average Risk   9.6   7.1 ? 3 X Average Risk  23.4   11.0 ?       ?Use the calculated Patient Ratio ?above and the CHD Risk Table ?to determine the patient's CHD Risk. ?       ?ATP III CLASSIFICATION (LDL): ? <100     mg/dL   Optimal ? 100-129  mg/dL   Near or Above ?                   Optimal ? 130-159  mg/dL   Borderline ? 160-189  mg/dL   High ? >190     mg/dL   Very High ?Performed at [facility], [place] [address] [place], [place] [address] ?  ?Hepatic function panel     Status: Abnormal  ? Collection Time: 06/26/21  6:51 AM  ?Result Value Ref Range  ? Total  Protein 7.1 6.5 - 8.1 g/dL  ? Albumin 4.1 3.5 - 5.0 g/dL  ? AST 64 (H) 15 - 41 U/L  ? ALT 117 (H) 0 - 44 U/L  ? Alkaline Phosphatase 81 38 - 126 U/L  ? Total Bilirubin 0.4 0.3 - 1.2 mg/dL  ? Bilirubin, Direct 0.1

## 2021-06-26 NOTE — Group Note (Signed)
Recreation Therapy Group Note ? ? ?Group Topic:Animal Assisted Therapy   ?Group Date: 06/26/2021 ?Start Time: 1430 ?End Time: 1515 ?Facilitators: Caroll Rancher, LRT,CTRS ?Location: [address] Day[address] ? ? ?Animal-Assisted Activity (AAA) Program Checklist/Progress Note ?Patient Eligibility Criteria Checklist & Daily Group note for Rec Tx Intervention ? ?AAA/T Program Assumption of Risk Form signed by Patient/ or Parent Legal Guardian YES ? ?Patient is free of allergies or severe asthma  YES ? ?Patient reports no fear of animals YES ? ?Patient reports no history of cruelty to animals YES ? ?Patient understands their participation is voluntary YES ? ?Patient washes hands before animal contact YES ? ?Patient washes hands after animal contact YES ? ?Group Description: Patients provided opportunity to interact with trained and credentialed [employer] Therapy dog and the community volunteer/dog handler. Patients practiced appropriate animal interaction and were educated on dog safety outside of the hospital in common community settings. Patients were allowed to use dog toys and other items to practice commands, engage the dog in play, and/or complete routine aspects of animal care.  ? ?Education: Charity fundraiser, Health visitor, Communication & Social Skills  ? ? ?Affect/Mood: Appropriate ?  ?Participation Level: Engaged ?  ? ?Clinical Observations/Individualized Feedback: Pt pet and interacted with therapy dog.  ? ? ?Plan: Continue to engage patient in RT group sessions 2-3x/week. ? ? ?Caroll Rancher, LRT,CTRS ?06/26/2021 4:00 PM ?

## 2021-06-26 NOTE — Progress Notes (Signed)
Adult Psychoeducational Group Note ? ?Date:  06/26/2021 ?Time:  8:52 PM ? ?Group Topic/Focus:  ?Wrap-Up Group:   The focus of this group is to help patients review their daily goal of treatment and discuss progress on daily workbooks. ? ?Participation Level:  Did Not Attend ? ?Participation Quality:   Did Not Attend ? ?Affect:   Did Not Attend ? ?Cognitive:   Did Not Attend ? ?Insight: None ? ?Engagement in Group:   Did Not Attend ? ?Modes of Intervention:   Did Not Attend ? ?Additional Comments:  Pt was encouraged to attend wrap up group but did not attend. ? ?Raymond [name] ?06/26/2021, 8:52 PM ?

## 2021-06-26 NOTE — Progress Notes (Signed)
?   06/26/21 0500  ?Sleep  ?Number of Hours 8  ? ? ?

## 2021-06-27 DIAGNOSIS — F39 Unspecified mood [affective] disorder: Secondary | ICD-10-CM | POA: Diagnosis not present

## 2021-06-27 LAB — LIPID PANEL
Cholesterol: 394 mg/dL — ABNORMAL HIGH (ref 0–200)
HDL: 52 mg/dL (ref >40)
LDL Cholesterol: UNDETERMINED mg/dL (ref 0–99)
Total CHOL/HDL Ratio: 7.6 RATIO
Triglycerides: 659 mg/dL — ABNORMAL HIGH (ref <150)
VLDL: UNDETERMINED mg/dL (ref 0–40)

## 2021-06-27 LAB — LDL CHOLESTEROL, DIRECT: Direct LDL: 197.4 mg/dL — ABNORMAL HIGH (ref 0–99)

## 2021-06-27 NOTE — BHH Group Notes (Signed)
PT attended NA and was attentive. No interpreter was provided for PT. ?

## 2021-06-27 NOTE — Progress Notes (Signed)
D. Pt has been appropriate on the unit, observed attending group led by SW today. Pt did not attend  rec therapy group this am due to having a headache. Per pt's self inventory, pt rated his depression, hopelessness and anxiety a 1/0/0, respectively. Pt reported that his goal today was to attend group, and keep his mind positive and relaxed.  Pt currently denies SI/HI and AVH and does not appear to be responding to internal stimuli.  ?A. Labs and vitals monitored. Pt given and educated on medications. Pt supported emotionally and encouraged to express concerns and ask questions.   ?R. Pt remains safe with 15 minute checks. Will continue POC. ? ?  ?

## 2021-06-27 NOTE — Progress Notes (Signed)
Adult Psychoeducational Group Note ? ?Date:  06/27/2021 ?Time:  5:18 PM ? ?Group Topic/Focus:  ?Goals Group:   The focus of this group is to help patients establish daily goals to achieve during treatment and discuss how the patient can incorporate goal setting into their daily lives to aide in recovery. ? ?Participation Level:  Active ? ?Participation Quality:  Appropriate ? ?Affect:  Appropriate ? ?Cognitive:  Appropriate ? ?Insight: Appropriate ? ?Engagement in Group:  Engaged ? ?Modes of Intervention:  Discussion ? ?Additional Comments:   ? ?Raymond [name] ?06/27/2021, 5:18 PMPatient attended morning orientation/goal setting group and participated.  ? ?

## 2021-06-27 NOTE — Group Note (Signed)
Recreation Therapy Group Note ? ? ?Group Topic:Team Building  ?Group Date: 06/27/2021 ?Start Time: 0930 ?End Time: 1000 ?Facilitators: Caroll Rancher, LRT,CTRS ?Location: [address] Day[address] ? ? ?Goal Area(s) Addresses:  ?Patient will effectively work with peer towards shared goal.  ?Patient will identify skills used to make activity successful.  ?Patient will identify how skills used during activity can be applied to reach post d/c goals.  ? ?Group Description: Tallest [employer]. In teams of 3-4, patients were given 25 small craft pipe cleaners. Using the materials provided, patients were instructed to compete again the opposing team(s) to build the tallest free-standing structure from floor level. The activity was timed; difficulty increased by Clinical research associate as Production designer, theatre/television/film continued.  Systematically resources were removed with additional directions for example, placing one arm behind their back, working in silence, and shape stipulations. LRT facilitated post-activity discussion reviewing team processes and necessary communication skills involved in completion. Patients were encouraged to reflect how the skills utilized, or not utilized, in this activity can be incorporated to positively impact support systems post discharge. ? ? ?Affect/Mood: N/A ?  ?Participation Level: Did not attend ?  ? ?Clinical Observations/Individualized Feedback:   ? ? ?Plan: Continue to engage patient in RT group sessions 2-3x/week. ? ? ?Caroll Rancher, LRT,CTRS  ?06/27/2021 12:59 PM ?

## 2021-06-27 NOTE — Progress Notes (Signed)
[facility] MD Progress Note ? ?06/27/2021 7:16 AM ?Mayo Ao  ?MRN:  [id number] ? ?Reason for Admission: Patient is a 26 year old Raymond [name] who denies having previously diagnosed psychiatric history, who was admitted to the psychiatric hospital for evaluation and treatment of suicidal thoughts with intent and plan to jump off bridge, paranoia, and auditory hallucinations.  He was admitted from [facility], which provided medical clearance. ? ?Psych Recommendations from yesterday: ?-Continue Lexapro 10 mg qd ?-Discontinue Zyprexa 7.5 qhs ?-Start Abilify 10 mg nightly to prevent further psychotic symptoms ? ?Subjective ?On evaluation today, the patient was interviewed with in person in Spanish interpreter. ?Patient reports his mind continues to be "calm" and denies any further hallucinations or psychotic symptoms.  Patient does report feeling mildly depressed and anxious due to his extremely elevated lipid panel.   ?Patient reports he is not experiencing any AVH/SI/HI. ?Patient reports appetite is appropriate as well as sleep is appropriate. ?Denies having side effects to current psychiatric medications.  Discussed with patient that we would continue to monitor his lipids with outpatient PCP for further follow-up.  ?  ?Patient denies any acute side effects from switching to Abilify.  Discussed that we would likely discharge if there were no further acute problems that arise.  Patient was agreeable plan and had no other questions at this time. ? ?Objective ?Chart Review ?Pas 24 hours of patient's chart was reviewed.  ?Patient is compliant with scheduled meds. ?Required Agitation PRNs: none ?Per RN notes, no documented behavioral issues and is  attending group. ?Patient slept, 7.25 hours ? ?Principal Problem: Psychoactive substance-induced psychosis (HCC) ?Diagnosis: Principal Problem: ?  Psychoactive substance-induced psychosis (HCC) ?Active Problems: ?  Adjustment disorder with mixed anxiety and depressed mood ?   Alcohol use disorder, severe, dependence (HCC) ?  Stimulant use disorder ? ? ?Total Time spent with patient: 30 minutes ? ?Past Psychiatric History:  ?Denies history of psychiatric diagnosis in the past ?Denies prior psychiatric hospitalization ?Denies history of suicide attempt ?Denies current psychiatric medication prescription ?Denies history of psychiatric medications ? ?Past Medical History:  ?History reviewed. No pertinent past medical history.  ?History reviewed. No pertinent surgical history. ?Family History: History reviewed. No pertinent family history. ? ?Family Psychiatric  History:  ?Denies family history of psychiatric illness.  Denies family history of suicide attempt. ? ?Social History:  ?Social History  ? ?Substance and Sexual Activity  ?Alcohol Use Yes  ? Alcohol/week: 30.0 standard drinks  ? Types: 30 Cans of beer per week  ? Comment: 30 beers on the weekend  ?   ?Social History  ? ?Substance and Sexual Activity  ?Drug Use Yes  ? Types: Cocaine  ?  ?Social History  ? ?Socioeconomic History  ? Marital status: Single  ?  Spouse name: Not on file  ? Number of children: Not on file  ? Years of education: Not on file  ? Highest education level: Not on file  ?Occupational History  ? Not on file  ?Tobacco Use  ? Smoking status: Every Day  ?  Types: Cigarettes  ? Smokeless tobacco: Never  ?Vaping Use  ? Vaping Use: Never used  ?Substance and Sexual Activity  ? Alcohol use: Yes  ?  Alcohol/week: 30.0 standard drinks  ?  Types: 30 Cans of beer per week  ?  Comment: 30 beers on the weekend  ? Drug use: Yes  ?  Types: Cocaine  ? Sexual activity: Not on file  ?Other Topics Concern  ? Not on file  ?  Social History Narrative  ? Not on file  ? ?Social Determinants of Health  ? ?Financial Resource Strain: Not on file  ?Food Insecurity: Not on file  ?Transportation Needs: Not on file  ?Physical Activity: Not on file  ?Stress: Not on file  ?Social Connections: Not on file  ? ?Additional Social History:  ?  ?  ?  ?  ?   ?  ?  ?  ?  ?  ?  ? ?Sleep: Good ? ?Appetite:  Good ? ?Current Medications: ?Current Facility-Administered Medications  ?Medication Dose Route Frequency Provider Last Rate Last Admin  ? alum & mag hydroxide-simeth (MAALOX/MYLANTA) 200-200-20 MG/5ML suspension 30 mL  30 mL Oral Q6H PRN Earney Navy, NP      ? ARIPiprazole (ABILIFY) tablet 10 mg  10 mg Oral QHS Park Pope, MD   10 mg at 06/26/21 2120  ? escitalopram (LEXAPRO) tablet 10 mg  10 mg Oral Daily Massengill, Harrold Donath, MD   10 mg at 06/26/21 9390  ? feeding supplement (ENSURE ENLIVE / ENSURE PLUS) liquid 237 mL  237 mL Oral BID BM Hill, Shelbie Hutching, MD   237 mL at 06/26/21 1519  ? fenofibrate tablet 54 mg  54 mg Oral Daily Cecilie Lowers, FNP   54 mg at 06/26/21 3009  ? folic acid (FOLVITE) tablet 1 mg  1 mg Oral Daily Dahlia Byes C, NP   1 mg at 06/26/21 2330  ? ibuprofen (ADVIL) tablet 400 mg  400 mg Oral Q6H PRN Massengill, Harrold Donath, MD      ? risperiDONE (RISPERDAL M-TABS) disintegrating tablet 2 mg  2 mg Oral Q8H PRN Comer Locket, MD      ? And  ? LORazepam (ATIVAN) tablet 1 mg  1 mg Oral PRN Comer Locket, MD      ? And  ? ziprasidone (GEODON) injection 20 mg  20 mg Intramuscular PRN Comer Locket, MD      ? magnesium hydroxide (MILK OF MAGNESIA) suspension 30 mL  30 mL Oral Daily PRN Dahlia Byes C, NP      ? multivitamin with minerals tablet 1 tablet  1 tablet Oral Daily Massengill, Nathan, MD   1 tablet at 06/26/21 0762  ? nicotine (NICODERM CQ - dosed in mg/24 hours) patch 14 mg  14 mg Transdermal Daily Roselle Locus, MD   14 mg at 06/26/21 0820  ? thiamine tablet 100 mg  100 mg Oral Daily Dahlia Byes C, NP   100 mg at 06/26/21 2633  ? traZODone (DESYREL) tablet 50 mg  50 mg Oral QHS PRN Dahlia Byes C, NP   50 mg at 06/26/21 2120  ? Vitamin D3 (Vitamin D) tablet 1,000 Units  1,000 Units Oral Daily Phineas Inches, MD   1,000 Units at 06/26/21 3545  ? ? ?Lab Results:  ?Results for orders placed or  performed during the hospital encounter of 06/18/21 (from the past 48 hour(s))  ?Lipid panel     Status: Abnormal  ? Collection Time: 06/26/21  6:51 AM  ?Result Value Ref Range  ? Cholesterol 365 (H) 0 - 200 mg/dL  ? Triglycerides 852 (H) <150 mg/dL  ? HDL 47 >40 mg/dL  ? Total CHOL/HDL Ratio 7.8 RATIO  ? VLDL UNABLE TO CALCULATE IF TRIGLYCERIDE OVER 400 mg/dL 0 - 40 mg/dL  ? LDL Cholesterol UNABLE TO CALCULATE IF TRIGLYCERIDE OVER 400 mg/dL 0 - 99 mg/dL  ?  Comment:        ?  Total Cholesterol/HDL:CHD Risk ?Coronary Heart Disease Risk Table ?                    Men   Women ? 1/2 Average Risk   3.4   3.3 ? Average Risk       5.0   4.4 ? 2 X Average Risk   9.6   7.1 ? 3 X Average Risk  23.4   11.0 ?       ?Use the calculated Patient Ratio ?above and the CHD Risk Table ?to determine the patient's CHD Risk. ?       ?ATP III CLASSIFICATION (LDL): ? <100     mg/dL   Optimal ? [id number]  mg/dL   Near or Above ?                   Optimal ? 130-159  mg/dL   Borderline ? 160-189  mg/dL   High ? >563>190     mg/dL   Very High ?Performed at [facility], [address] [place], [place] [address] ?  ?Hepatic function panel     Status: Abnormal  ? Collection Time: 06/26/21  6:51 AM  ?Result Value Ref Range  ? Total Protein 7.1 6.5 - 8.1 g/dL  ? Albumin 4.1 3.5 - 5.0 g/dL  ? AST 64 (H) 15 - 41 U/L  ? ALT 117 (H) 0 - 44 U/L  ? Alkaline Phosphatase 81 38 - 126 U/L  ? Total Bilirubin 0.4 0.3 - 1.2 mg/dL  ? Bilirubin, Direct 0.1 0.0 - 0.2 mg/dL  ? Indirect Bilirubin 0.3 0.3 - 0.9 mg/dL  ?  Comment: Performed at [facility], [address] [place], [place] [address]  ?LDL cholesterol, direct     Status: Abnormal  ? Collection Time: 06/26/21  6:51 AM  ?Result Value Ref Range  ? Direct LDL 164.3 (H) 0 - 99 mg/dL  ?  Comment: Performed at [facility], [address] [place], [place] [address]  ? ? ?Blood Alcohol level:  ?Lab Results  ?Component Value Date  ? ETH <10 06/15/2021  ? ? ?Metabolic  Disorder Labs: ?Lab Results  ?Component Value Date  ? HGBA1C 5.4 06/19/2021  ? MPG 108.28 06/19/2021  ? ?No results found for: PROLACTIN ?Lab Results  ?Component Value Date  ? CHOL 365 (H) 06/26/2021  ? T

## 2021-06-27 NOTE — Group Note (Signed)
LCSW Group Therapy Note ? ? ?Group Date: 06/27/2021 ?Start Time: 1300 ?End Time: 1400 ? ?Type of Therapy and Topic:  Group Therapy:  Self-Care after Hospitalization ? ?Participation Level:  Active  ? ?Description of Group ?This process group involved patients discussing how they plan to take care of themselves in a better manner when they get home from the hospital.  The group started with patients listing one healthy and one unhealthy way they took care of themselves prior to hospitalization.  A discussion ensued about the differences in healthy and unhealthy coping skills.  Group members shared ideas about making changes when they return home so that they can stay well and in recovery.  The white board was used to list ideas so that patients can continue to see these ideas throughout the day. ? ?Therapeutic Goals ?Patient will identify and describe one healthy and one unhealthy coping technique used prior to hospitalization ?Patient will participate in generating ideas about healthy self-care options when they return to the community ?Patients will be supportive of one another and receive said support from others ?Patient will identify one healthy self-care activity to add to his/her post-hospitalization life that can help in recovery ? ?Summary of Patient Progress:  The patient expressed that prior to hospitalization some healthy self-care activity they engaged in was sports.  Patient's participated in group and was appropriate with their peers.   Patient accepted all worksheets and followed along with all group discussions.  ? ? ?Therapeutic Modalities ?Brief Solution-Focused Therapy ?Motivational Interviewing ?Psychoeducation ? ?Aram Beecham, LCSWA ?06/27/2021  2:03 PM   ? ?

## 2021-06-28 DIAGNOSIS — F39 Unspecified mood [affective] disorder: Secondary | ICD-10-CM | POA: Diagnosis not present

## 2021-06-28 LAB — HEPATIC FUNCTION PANEL
ALT: 157 U/L — ABNORMAL HIGH (ref 0–44)
AST: 97 U/L — ABNORMAL HIGH (ref 15–41)
Albumin: 4.3 g/dL (ref 3.5–5.0)
Alkaline Phosphatase: 78 U/L (ref 38–126)
Bilirubin, Direct: <0.1 mg/dL (ref 0.0–0.2)
Total Bilirubin: 0.6 mg/dL (ref 0.3–1.2)
Total Protein: 7.4 g/dL (ref 6.5–8.1)

## 2021-06-28 MED ORDER — VITAMIN D3 25 MCG PO TABS: 1000.0000 [IU] | ORAL_TABLET | Freq: Every day | ORAL | 0 refills | Status: AC

## 2021-06-28 MED ORDER — ESCITALOPRAM OXALATE 10 MG PO TABS: 10.0000 mg | ORAL_TABLET | Freq: Every day | ORAL | 0 refills | Status: AC

## 2021-06-28 MED ORDER — TRAZODONE HCL 50 MG PO TABS: 50.0000 mg | ORAL_TABLET | Freq: Every evening | ORAL | 0 refills | Status: AC | PRN

## 2021-06-28 MED ORDER — NICOTINE 14 MG/24HR TD PT24: 14.0000 mg | MEDICATED_PATCH | Freq: Every day | TRANSDERMAL | 0 refills | Status: AC

## 2021-06-28 MED ORDER — ARIPIPRAZOLE 15 MG PO TABS
15.0000 mg | ORAL_TABLET | Freq: Every day | ORAL | 0 refills | Status: AC
Start: 2021-06-28 — End: 2021-07-28

## 2021-06-28 MED ORDER — ARIPIPRAZOLE 15 MG PO TABS
15.0000 mg | ORAL_TABLET | Freq: Every day | ORAL | Status: DC
  Filled 2021-06-28: qty 7
  Filled 2021-06-28: qty 1

## 2021-06-28 NOTE — Progress Notes (Signed)
?  [facility] Adult Case Management Discharge Plan : ? ?Will you be returning to the same living situation after discharge:  Yes,  staying with a friend ?At discharge, do you have transportation home?: Yes,  friend will be picking patient up ?Do you have the ability to pay for your medications: Yes,  patient provided goodrx card to assist with affordability of meds ? ?Release of information consent forms completed and in the chart;  Patient's signature needed at discharge. ? ?Patient to Follow up at: ? Follow-up Information   ? ? [place] Follow up on 08/17/2021.   ?Specialty: Professional Counselor ?Why: Please go to this provider to obtain therapy and medication management services during walk in times for new patients:  9:00 am to 1:00 pm, Monday through Friday.  You have an appointment for therapy services already scheduled for 08/17/21 at 10:00 am, but you must confirm this appointment as soon as possible after discharge. ?Contact information: ?[place] ?[place] [address] ?[phone] ? ? ?  ?  ? ? [place]. Go on 07/30/2021.   ?Why: You have a hospital follow up appointment for primary care services on 07/30/21 at 2:15 pm with Dr. Wynetta Emery.  This appointment will be held in person. ?Contact information: ?[place] ?[place] [phone] ?[phone] ? ?  ?  ? ?  ?  ? ?  ? ? ?Next level of care provider has access to [place] ? ?Safety Planning and Suicide Prevention discussed: Yes,  provided to patient, did not have a good contact number for support system ? ?  ? ?Has patient been referred to the Quitline?: Patient refused referral ? ?Patient has been referred for addiction treatment: Yes, [facility] [place] ? ?Raymond Paulding E Dontez Hauss, LCSW ?06/28/2021, 9:39 AM ?

## 2021-06-28 NOTE — [facility] Suicide Risk Assessment (Addendum)
Suicide Risk Assessment ? ?Discharge Assessment    ?[facility] Discharge Suicide Risk Assessment ? ? ?Principal Problem: Psychoactive substance-induced psychosis (HCC) ?Discharge Diagnoses: Principal Problem: ?  Psychoactive substance-induced psychosis (HCC) ?Active Problems: ?  Adjustment disorder with mixed anxiety and depressed mood ?  Alcohol use disorder, severe, dependence (HCC) ?  Stimulant use disorder ? ?Total Time spent with patient: 30 minutes ? ?During the patient's hospitalization, patient had extensive initial psychiatric evaluation, and follow-up psychiatric evaluations every day. ? ?Psychiatric diagnoses provided upon initial assessment:  ?Substance-induced psychotic disorder ?Adjustment disorder with depressed and anxious features ?Alcohol use disorder ?Stimulant use disorder ? ?Patient's psychiatric medications were adjusted on admission:  ?-Zyprexa 10 mg nightly for substance induced psychosis ?-Lexapro 10 once daily for depressive symptoms ?-Nicotine replacement therapy ? ?During the hospitalization, other adjustments were made to the patient's psychiatric medication regimen:  ?-Transition from Zyprexa to Abilify due to suspected iatrogenic dyslipidemia ?-Titrated Abilify to 15 mg upon discharge due to refractory auditory hallucinations ?-Started fenofibrate for patient's hypertriglyceridemia but was held upon discharge due to transaminitis ?-Continue nicotine replacement therapy ? ?Gradually, patient started adjusting to milieu.   ?Patient's care was discussed during the interdisciplinary team meeting every day during the hospitalization. ? ?The patient denied having notable side effects to prescribed psychiatric medication.  Patient did have dyslipidemia and appears to be related to increased dose of Zyprexa which was the reason for the transition to Abilify. ? ?The patient reports their target psychiatric symptoms of depression and hallucinations responded well to the psychiatric medications, and the  patient reports overall benefit other psychiatric hospitalization. Supportive psychotherapy was provided to the patient. The patient also participated in regular group therapy while admitted.  ? ?Labs were reviewed with the patient, and abnormal results were discussed with the patient. ? ?The patient denied having suicidal thoughts more than 48 hours prior to discharge.  Patient denies having homicidal thoughts.  Patient denies having auditory hallucinations.  Patient denies any visual hallucinations.  Patient denies having paranoid thoughts. ? ?The patient is able to verbalize their individual safety plan to this provider. ? ?It is recommended to the patient to continue psychiatric medications as prescribed, after discharge from the hospital.   ? ?It is recommended to the patient to follow up with your outpatient psychiatric provider and PCP. ? ?Discussed with the patient, the impact of alcohol, drugs, tobacco have been there overall psychiatric and medical wellbeing, and total abstinence from substance use was recommended the patient. ? ?Musculoskeletal: ?Strength & Muscle Tone: within normal limits ?Gait & Station: normal ?Patient leans: N/A ? ?Psychiatric Specialty Exam ? ?Presentation  ?General Appearance: Casual; Appropriate for Environment; Fairly Groomed ? ?Eye Contact:Good ? ?Speech:Normal Rate ? ?Speech Volume:Normal ? ?Handedness:Right ? ? ?Mood and Affect  ?Mood:Euthymic ? ?Duration of Depression Symptoms: No data recorded ?Affect:Appropriate; Congruent; Full Range ? ? ?Thought Process  ?Thought Processes:Linear ? ?Descriptions of Associations:Intact ? ?Orientation:Full (Time, Place and Person) ? ?Thought Content:Logical ? ?History of Schizophrenia/Schizoaffective disorder:No ? ?Duration of Psychotic Symptoms:Less than six months ? ?Hallucinations:Hallucinations: Auditory ?Description of Auditory Hallucinations: whispers, denies CAH ? ?Ideas of Reference:None ? ?Suicidal Thoughts:Suicidal Thoughts:  No ? ?Homicidal Thoughts:Homicidal Thoughts: No ? ? ?Sensorium  ?Memory:Immediate Good; Recent Good; Remote Good ? ?Judgment:Good ? ?Insight:Shallow ? ? ?Executive Functions  ?Concentration:Good ? ?Attention Span:Good ? ?Recall:Good ? ?Fund of Knowledge:Good ? ?Language:Good ? ? ?Psychomotor Activity  ?Psychomotor Activity:Psychomotor Activity: Normal ? ? ?Assets  ?Assets:Communication Skills; Social Support; Talents/Skills ? ? ?Sleep  ?Sleep:Sleep: Good ?Number  of Hours of Sleep: 7.25 ? ? ?Physical Exam: ?Physical Exam ?Vitals and nursing note reviewed.  ?Constitutional:   ?   Appearance: Normal appearance. He is normal weight.  ?HENT:  ?   Head: Normocephalic and atraumatic.  ?Pulmonary:  ?   Effort: Pulmonary effort is normal.  ?Neurological:  ?   General: No focal deficit present.  ?   Mental Status: He is oriented to person, place, and time.  ? ?Review of Systems  ?Psychiatric/Behavioral:  Positive for hallucinations and substance abuse. Negative for depression, memory loss and suicidal ideas. The patient is not nervous/anxious and does not have insomnia.   ?     Auditory Hallucination: whispers of friends talking to him  ?Blood pressure (!) 141/75, pulse 89, temperature 98 ?F (36.7 ?C), resp. rate 17, height 5' 5.5" (1.664 m), weight 67.1 kg, SpO2 99 %. Body mass index is 24.25 kg/m?. ? ?Mental Status Per Nursing Assessment::   ?On Admission:  Self-harm thoughts, Self-harm behaviors, Suicidal ideation indicated by patient ? ?Demographic Factors:  ?Male and Low socioeconomic status ? ?Loss Factors: ?NA ? ?Historical Factors: ?NA ? ?Risk Reduction Factors:   ?Employed, Positive social support, and Positive coping skills or problem solving skills ? ?Continued Clinical Symptoms:  ?Alcohol/Substance Abuse/Dependencies ? ?Cognitive Features That Contribute To Risk:  ?Closed-mindedness   ? ?Suicide Risk:  ?Mild:  There are no identifiable suicide plans, no associated intent, mild dysphoria and related symptoms, good  self-control (both objective and subjective assessment), few other risk factors, and identifiable protective factors, including available and accessible social support. ? ? Follow-up Information   ? ? [place] Follow up on 08/17/2021.   ?Specialty: Professional Counselor ?Why: Dir?jase a este proveedor para obtener servicios de administraci?n de medicamentos y terapia durante el horario de atenci?n para pacientes nuevos: de 9:00 am a 1:00 pm, de lunes a viernes. Tiene una cita para servicios de terapia ya programada para el 23/6/23 a las 10:00 a. m., pero debe confirmar esta cita lo antes posible despu?s del alta. ?Contact information: ?[address] ?[place] [address] ?[phone] ? ? ?  ?  ? ? [facility]. Go on 07/30/2021.   ?Why: Tiene una cita de seguimiento en el hospital para recibir servicios de atenci?n primaria el 06/30/21 a las 2:15 p. m. con el Dr. Laural Benes. Esta cita se realizar? de forma presencial. ?Contact information: ?[address] ?[place] [address] ?[phone] ? ?  ?  ? ?  ?  ? ?  ? ? ?Plan Of Care/Follow-up recommendations:  ?Activity: as tolerated ? ?Diet: heart healthy ? ?Other: ?-Please follow-up at [facility] (address listed in your discharge paperwork) on 06/28/21 or 06/29/21 to follow up on your elevated liver enzymes, triglycerides, and cholesterol ? ?-Follow-up with your outpatient psychiatric provider -instructions on appointment date, time, and address (location) are provided to you in discharge paperwork. ? ?-Take your psychiatric medications as prescribed at discharge - instructions are provided to you in the discharge paperwork ? ?-Testing: Follow-up with outpatient provider for abnormal lab results: AST 97, ALT 157, LDL Cholesterol 197.4 (recommend statin) ? ?-Recommend abstinence from alcohol, tobacco, and other illicit drug use at discharge.  ? ?-If your psychiatric  symptoms recur, worsen, or if you have side effects to your psychiatric medications, call your outpatient psychiatric provider, 911, [phone] or go to the nearest emergency department. ? ?-If suicidal thought

## 2021-06-28 NOTE — Progress Notes (Signed)
?   06/28/21 0600  ?Psych Admission Type (Psych Patients Only)  ?Admission Status Voluntary  ?Psychosocial Assessment  ?Patient Complaints None  ?Eye Contact Fair  ?Facial Expression Anxious  ?Affect Anxious  ?Speech Logical/coherent  ?Interaction Assertive  ?Motor Activity Slow  ?Appearance/Hygiene Unremarkable  ?Behavior Characteristics Cooperative  ?Mood Pleasant  ?Aggressive Behavior  ?Effect No apparent injury  ?Thought Process  ?Coherency WDL  ?Content WDL  ?Delusions WDL  ?Perception WDL  ?Hallucination None reported or observed  ?Judgment Limited  ?Confusion WDL  ?Danger to Self  ?Current suicidal ideation? Denies  ? ? ?

## 2021-06-28 NOTE — Discharge Summary (Signed)
Physician Discharge Summary Note ? ?Patient:  Raymond [name] is an 26 y.o., male ?MRN:  [id number] ?DOB:  Mar 12, 1995 ?Patient phone:  [phone] (home)  ?Patient address:   ?[address] ?[address] ?[place] [address],  ?Total Time spent with patient: 30 minutes ? ?Date of Admission:  06/18/2021 ?Date of Discharge: 06/28/21 ? ?Reason for Admission:  Patient is a 26 year old male who denies having previously diagnosed psychiatric history, who was admitted to the psychiatric hospital for evaluation and treatment of suicidal thoughts with intent and plan to jump off bridge, paranoia, and auditory hallucinations.  He was admitted from [facility], which provided medical clearance. ? ?Principal Problem: Psychoactive substance-induced psychosis (HCC) ?Discharge Diagnoses: Principal Problem: ?  Psychoactive substance-induced psychosis (HCC) ?Active Problems: ?  Adjustment disorder with mixed anxiety and depressed mood ?  Alcohol use disorder, severe, dependence (HCC) ?  Stimulant use disorder ? ? ?Past Psychiatric History:  ?Denies history of psychiatric diagnosis in the past ?Denies prior psychiatric hospitalization ?Denies history of suicide attempt ?Denies current psychiatric medication prescription ?Denies history of psychiatric medications ? ?Past Medical History: History reviewed. No pertinent past medical history. History reviewed. No pertinent surgical history. ?Family History: History reviewed. No pertinent family history. ?Family Psychiatric  History:  ?Denies family history of psychiatric illness.  Denies family history of suicide attempt. ?Social History:  ?Social History  ? ?Substance and Sexual Activity  ?Alcohol Use Yes  ? Alcohol/week: 30.0 standard drinks  ? Types: 30 Cans of beer per week  ? Comment: 30 beers on the weekend  ?   ?Social History  ? ?Substance and Sexual Activity  ?Drug Use Yes  ? Types: Cocaine  ?  ?Social History  ? ?Socioeconomic History  ? Marital  status: Single  ?  Spouse name: Not on file  ? Number of children: Not on file  ? Years of education: Not on file  ? Highest education level: Not on file  ?Occupational History  ? Not on file  ?Tobacco Use  ? Smoking status: Every Day  ?  Types: Cigarettes  ? Smokeless tobacco: Never  ?Vaping Use  ? Vaping Use: Never used  ?Substance and Sexual Activity  ? Alcohol use: Yes  ?  Alcohol/week: 30.0 standard drinks  ?  Types: 30 Cans of beer per week  ?  Comment: 30 beers on the weekend  ? Drug use: Yes  ?  Types: Cocaine  ? Sexual activity: Not on file  ?Other Topics Concern  ? Not on file  ?Social History Narrative  ? Not on file  ? ?Social Determinants of Health  ? ?Financial Resource Strain: Not on file  ?Food Insecurity: Not on file  ?Transportation Needs: Not on file  ?Physical Activity: Not on file  ?Stress: Not on file  ?Social Connections: Not on file  ? ? ?Hospital Course:   ?  ?During the hospitalization, other adjustments were made to the patient's psychiatric medication regimen:  ?-Transition from Zyprexa to Abilify due to suspected iatrogenic dyslipidemia ?-Titrated Abilify to 15 mg upon discharge due to refractory auditory hallucinations ?-Started fenofibrate for patient's hypertriglyceridemia but was held upon discharge due to transaminitis ?-Continue nicotine replacement therapy ?  ?Gradually, patient started adjusting to milieu.   ?Patient's care was discussed during the interdisciplinary team meeting every day during the hospitalization. ?  ?The patient denied having notable side effects to prescribed psychiatric medication.  Patient did have dyslipidemia and appears to be related to increased dose  of Zyprexa which was the reason for the transition to Abilify. ?  ?The patient reports their target psychiatric symptoms of depression and hallucinations responded well to the psychiatric medications, and the patient reports overall benefit other psychiatric hospitalization. Supportive psychotherapy was  provided to the patient. The patient also participated in regular group therapy while admitted.  ?  ?Labs were reviewed with the patient, and abnormal results were discussed with the patient. ?  ?The patient denied having suicidal thoughts more than 48 hours prior to discharge.  Patient denies having homicidal thoughts.  Patient denies having auditory hallucinations.  Patient denies any visual hallucinations.  Patient denies having paranoid thoughts. ?  ?The patient is able to verbalize their individual safety plan to this provider. ?  ?It is recommended to the patient to continue psychiatric medications as prescribed, after discharge from the hospital.   ?  ?It is recommended to the patient to follow up with your outpatient psychiatric provider and PCP. ?  ?Discussed with the patient, the impact of alcohol, drugs, tobacco have been there overall psychiatric and medical wellbeing, and total abstinence from substance use was recommended the patient. ? ? ?Physical Findings: ?AIMS: Facial and Oral Movements ?Muscles of Facial Expression: None, normal ?Lips and Perioral Area: None, normal ?Jaw: None, normal ?Tongue: None, normal,Extremity Movements ?Upper (arms, wrists, hands, fingers): None, normal ?Lower (legs, knees, ankles, toes): None, normal, Trunk Movements ?Neck, shoulders, hips: None, normal, Overall Severity ?Severity of abnormal movements (highest score from questions above): None, normal ?Incapacitation due to abnormal movements: None, normal ?Patient's awareness of abnormal movements (rate only patient's report): No Awareness, Dental Status ?Current problems with teeth and/or dentures?: No ?Does patient usually wear dentures?: No  ?CIWA:  CIWA-Ar Total: 1 ? ? ?Musculoskeletal: ?Strength & Muscle Tone: within normal limits ?Gait & Station: normal ?Patient leans: N/A ? ? ?Psychiatric Specialty Exam: ? ?Presentation  ?General Appearance: Casual; Appropriate for Environment; Fairly Groomed ? ?Eye  Contact:Good ? ?Speech:Normal Rate ? ?Speech Volume:Normal ? ?Handedness:Right ? ? ?Mood and Affect  ?Mood:Euthymic ? ?Affect:Appropriate; Congruent; Full Range ? ? ?Thought Process  ?Thought Processes:Linear ? ?Descriptions of Associations:Intact ? ?Orientation:Full (Time, Place and Person) ? ?Thought Content:Logical ? ?History of Schizophrenia/Schizoaffective disorder:No ? ?Duration of Psychotic Symptoms:Less than six months ? ?Hallucinations:Hallucinations: Auditory ?Description of Auditory Hallucinations: whispers, denies CAH ? ?Ideas of Reference:None ? ?Suicidal Thoughts:Suicidal Thoughts: No ? ?Homicidal Thoughts:Homicidal Thoughts: No ? ? ?Sensorium  ?Memory:Immediate Good; Recent Good; Remote Good ? ?Judgment:Good ? ?Insight:Shallow ? ? ?Executive Functions  ?Concentration:Good ? ?Attention Span:Good ? ?Recall:Good ? ?Fund of Knowledge:Good ? ?Language:Good ? ? ?Psychomotor Activity  ?Psychomotor Activity:Psychomotor Activity: Normal ? ? ?Assets  ?Assets:Communication Skills; Social Support; Talents/Skills ? ? ?Sleep  ?Sleep:Sleep: Good ?Number of Hours of Sleep: 7.25 ? ? ? ?Physical Exam: ?Physical Exam ?Vitals and nursing note reviewed.  ?Constitutional:   ?   Appearance: Normal appearance. He is normal weight.  ?HENT:  ?   Head: Normocephalic and atraumatic.  ?Pulmonary:  ?   Effort: Pulmonary effort is normal.  ?Neurological:  ?   General: No focal deficit present.  ?   Mental Status: He is oriented to person, place, and time.  ? ?Review of Systems  ?Respiratory:  Negative for shortness of breath.   ?Cardiovascular:  Negative for chest pain.  ?Gastrointestinal:  Negative for abdominal pain, constipation, diarrhea, heartburn, nausea and vomiting.  ?Neurological:  Negative for headaches.  ?Psychiatric/Behavioral:  Positive for hallucinations and substance abuse. Negative for depression, memory loss and  suicidal ideas. The patient is not nervous/anxious and does not have insomnia.   ?Blood pressure (!)  141/75, pulse 89, temperature 98 ?F (36.7 ?C), resp. rate 17, height 5' 5.5" (1.664 m), weight 67.1 kg, SpO2 99 %. Body mass index is 24.25 kg/m?. ? ? ?Social History  ? ?Tobacco Use  ?Smoking Status Every Day  ? Types:

## 2021-06-28 NOTE — [facility] Group Notes (Signed)
Group Goals      Musician) ?10-10 ?PT attended group and contributed to group  ?

## 2021-06-28 NOTE — Discharge Instructions (Signed)
?-  Follow-up with your outpatient psychiatric provider -instructions on appointment date, time, and address (location) are provided to you in discharge paperwork. ? ?-Take your psychiatric medications as prescribed at discharge - instructions are provided to you in the discharge paperwork ?Abilify was increased from 10 mg to 15 mg once nightly at discharge, to address recurrence of auditory hallucinations.  ? ?-Follow-up with outpatient primary care doctor and other specialists -for management of chronic medical disease, including: elevated triglyceride levels, elevated LFTs  ?**PLEASE FOLLOW-UP AT [facility] AS [facility] WALK-IN ON Thursday OR Friday (5-4 or 5-5) for further workup and treatment of your cholesterol, triglyceride levels, and elevated LFTs. Fenofibrate was stopped prior to discharge, due to concern this could be causing increasing LFTs.  ? ?-Testing: Follow-up with outpatient provider for abnormal lab results:  ?AST 97 ?ALT 157 ?Tot chol 394 ?LDL 197.4 ?HDL 52 ?TG 659  ? ?-Recommend abstinence from alcohol, tobacco, and other illicit drug use at discharge.  ? ?-If your psychiatric symptoms recur, worsen, or if you have side effects to your psychiatric medications, call your outpatient psychiatric provider, 911, [phone] or go to the nearest emergency department. ? ?-If suicidal thoughts recur, call your outpatient psychiatric provider, 911, [phone] or go to the nearest emergency department. ? ?

## 2021-06-28 NOTE — [facility] Group Notes (Signed)
Adult Psychoeducational Group Note ? ?Date:  06/28/2021 ?Time:  3:01 PM ? ?Group Topic/Focus:  ?Wellness Toolbox:   The focus of this group is to discuss various aspects of wellness, balancing those aspects and exploring ways to increase the ability to experience wellness.  Patients will create a wellness toolbox for use upon discharge. ? ?Participation Level:  Active ? ?Participation Quality:  Attentive ? ?Affect:  Appropriate ? ?Cognitive:  Alert ? ?Insight: Appropriate ? ?Engagement in Group:  Engaged ? ?Modes of Intervention:  Activity ? ?Additional Comments:  Patient attended and participated in the relaxation group activity. ? ?Jearl Klinefelter ?06/28/2021, 3:01 PM ?

## 2021-06-28 NOTE — Progress Notes (Signed)
Cleve's friend came to pick pt up.  Pt belongings given to pt.  SRA, Transition Record and AVS given to pt along with medications.  Pt thanked staff for care provided to pt. ?

## 2021-06-28 NOTE — [facility] Suicide Risk Assessment (Signed)
[facility] INPATIENT:  Family/Significant Other Suicide Prevention Education ? ?Suicide Prevention Education:  ?Contact Attempts: Rolando from [facility], [phone] of family member/significant other) has been identified by the patient as the family member/significant other with whom the patient will be residing, and identified as the person(s) who will aid the patient in the event of a mental health crisis.  With written consent from the patient, two attempts were made to provide suicide prevention education, prior to and/or following the patient's discharge.  We were unsuccessful in providing suicide prevention education.  A suicide education pamphlet was given to the patient to share with family/significant other. ? ?Date and time of first attempt: 4/27 @ 10am ?Date and time of second attempt:5/1 @ 2pm ? ?Raymond [name] ?06/28/2021, 9:15 AM ?

## 2021-06-28 NOTE — Progress Notes (Signed)
Order received for patients discharge. Patient discharge AVS instructions reviewed with spanish interperter via wall-e. Patient instructed on importance of sobriety, follow up care and crisis care as needed. Patient denies any SI or HI and no signs of acute decompensation. Patient verbalized understanding of follow up plan. Free supply of medications also provided. Patient is awaiting his friend to come at 82. All instructions completed. All belongings returned. ?

## 2021-07-30 ENCOUNTER — Inpatient Hospital Stay: Payer: Self-pay | Admitting: Internal Medicine

## 2022-05-28 IMAGING — CR DG CHEST 2V
2 series · 2 of 2 positions shown · non-contrast
Comparison: None.

CLINICAL DATA: Suicidal ideation.

EXAM:
CHEST - 2 VIEW

[w chest pa]
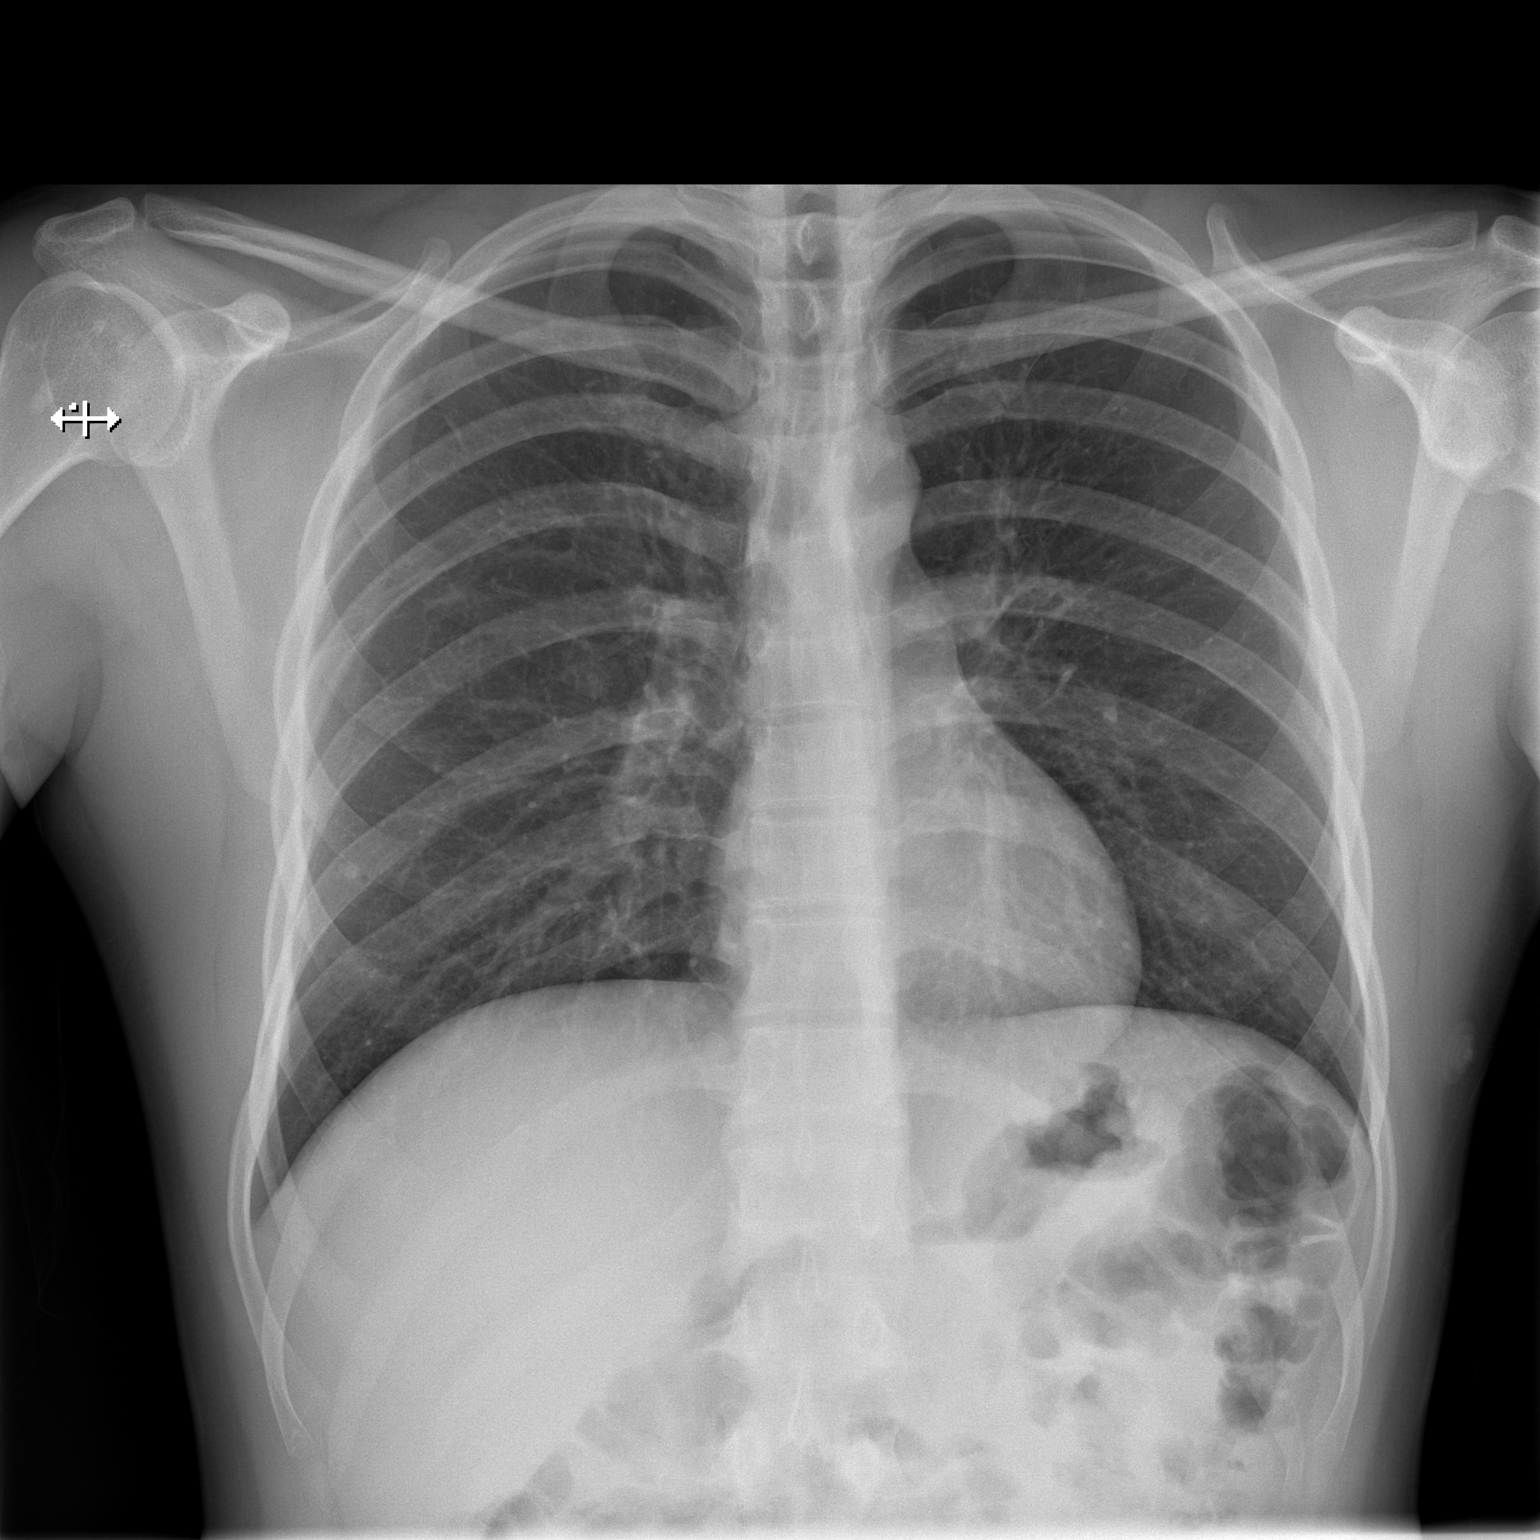

[w chest lat]
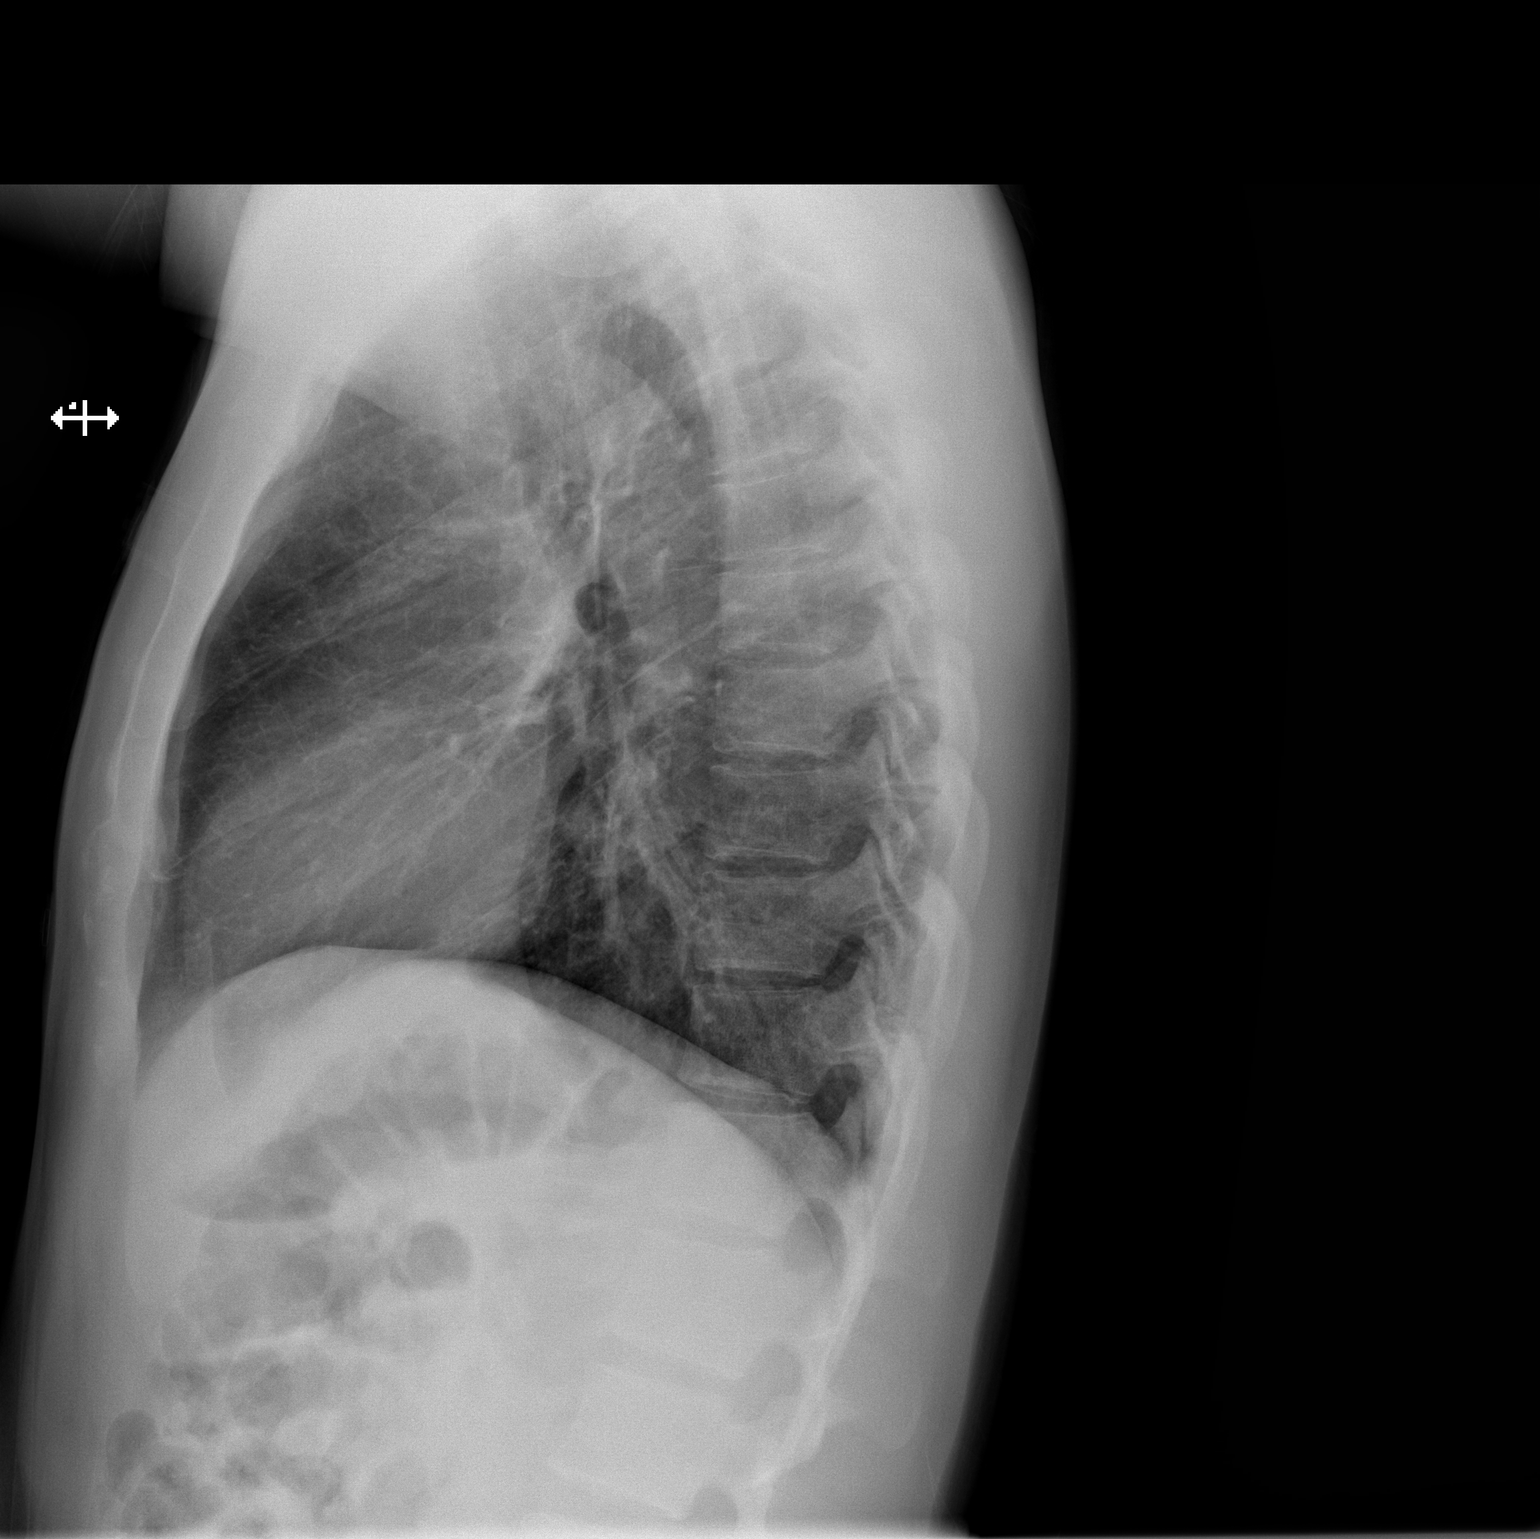

[2 of 2 positions shown; findings below may reference images not displayed]

FINDINGS: The heart size and mediastinal contours are within normal limits.
Both lungs are clear of infiltrates. There is a 3.5 mm rounded
density superimposing in the lateral right lower lung field. This
could be a lung nodule, a small nipple shadow, or a bone island in
the posterior right eighth rib. The visualized skeletal structures
are unremarkable.
IMPRESSION: No evidence of acute chest disease. 3.5 mm lung nodule or nipple
shadow versus posterior right eighth rib bone island right lower
lung field, PA view only. Consider follow-up imaging with shallow
oblique chest radiographs with nipple markers in place.
